# Patient Record
Sex: Female | Born: 1985
Health system: Southern US, Community
[De-identification: ages and names within clinical notes are randomized; demographics above are authoritative.]

## PROBLEM LIST (undated history)

## (undated) DIAGNOSIS — R87619 Unspecified abnormal cytological findings in specimens from cervix uteri: Secondary | ICD-10-CM

## (undated) DIAGNOSIS — T8859XA Other complications of anesthesia, initial encounter: Secondary | ICD-10-CM

## (undated) DIAGNOSIS — E559 Vitamin D deficiency, unspecified: Secondary | ICD-10-CM

## (undated) DIAGNOSIS — A6000 Herpesviral infection of urogenital system, unspecified: Secondary | ICD-10-CM

## (undated) DIAGNOSIS — T4145XA Adverse effect of unspecified anesthetic, initial encounter: Secondary | ICD-10-CM

## (undated) DIAGNOSIS — E66811 Obesity, class 1: Secondary | ICD-10-CM

## (undated) DIAGNOSIS — J302 Other seasonal allergic rhinitis: Secondary | ICD-10-CM

## (undated) DIAGNOSIS — E669 Obesity, unspecified: Secondary | ICD-10-CM

## (undated) DIAGNOSIS — O24419 Gestational diabetes mellitus in pregnancy, unspecified control: Secondary | ICD-10-CM

## (undated) HISTORY — DX: Other seasonal allergic rhinitis: J30.2

## (undated) HISTORY — DX: Obesity, unspecified: E66.9

## (undated) HISTORY — DX: Obesity, class 1: E66.811

## (undated) HISTORY — DX: Vitamin D deficiency, unspecified: E55.9

## (undated) HISTORY — DX: Herpesviral infection of urogenital system, unspecified: A60.00

## (undated) HISTORY — DX: Unspecified abnormal cytological findings in specimens from cervix uteri: R87.619

---

## 2010-03-30 ENCOUNTER — Emergency Department (HOSPITAL_COMMUNITY): Admission: EM | Admit: 2010-03-30 | Discharge: 2010-03-30 | Payer: Self-pay | Admitting: Emergency Medicine

## 2010-06-24 ENCOUNTER — Emergency Department (HOSPITAL_COMMUNITY): Admission: EM | Admit: 2010-06-24 | Discharge: 2010-06-24 | Payer: Self-pay | Admitting: Emergency Medicine

## 2010-06-28 ENCOUNTER — Emergency Department (HOSPITAL_COMMUNITY): Admission: EM | Admit: 2010-06-28 | Discharge: 2010-06-28 | Payer: Self-pay | Admitting: Emergency Medicine

## 2010-07-22 ENCOUNTER — Emergency Department (HOSPITAL_COMMUNITY): Admission: EM | Admit: 2010-07-22 | Discharge: 2010-07-22 | Payer: Self-pay | Admitting: Emergency Medicine

## 2010-08-13 ENCOUNTER — Ambulatory Visit (HOSPITAL_COMMUNITY): Admission: RE | Admit: 2010-08-13 | Discharge: 2010-08-13 | Payer: Self-pay | Admitting: Family Medicine

## 2010-12-31 LAB — URINALYSIS, ROUTINE W REFLEX MICROSCOPIC
Glucose, UA: NEGATIVE mg/dL
Glucose, UA: NEGATIVE mg/dL
Hgb urine dipstick: NEGATIVE
Hgb urine dipstick: NEGATIVE
Protein, ur: NEGATIVE mg/dL
Specific Gravity, Urine: 1.023 (ref 1.005–1.030)
Specific Gravity, Urine: 1.021 (ref 1.005–1.030)
pH: 6 (ref 5.0–8.0)
pH: 6.5 (ref 5.0–8.0)

## 2010-12-31 LAB — HCG, QUANTITATIVE, PREGNANCY: hCG, Beta Chain, Quant, S: 74406 m[IU]/mL — ABNORMAL HIGH (ref <5)

## 2010-12-31 LAB — DIFFERENTIAL
Basophils Absolute: 0.1 10*3/uL (ref 0.0–0.1)
Basophils Relative: 0 % (ref 0–1)
Basophils Relative: 1 % (ref 0–1)
Lymphocytes Relative: 25 % (ref 12–46)
Lymphs Abs: 1.7 10*3/uL (ref 0.7–4.0)
Monocytes Absolute: 0.4 10*3/uL (ref 0.1–1.0)
Monocytes Relative: 4 % (ref 3–12)
Neutro Abs: 7.3 10*3/uL (ref 1.7–7.7)
Neutro Abs: 5 10*3/uL (ref 1.7–7.7)
Neutrophils Relative %: 63 % (ref 43–77)

## 2010-12-31 LAB — CBC
MCHC: 34 g/dL (ref 30.0–36.0)
Platelets: 249 10*3/uL (ref 150–400)
Platelets: 252 10*3/uL (ref 150–400)
RDW: 13.5 % (ref 11.5–15.5)
RDW: 13.7 % (ref 11.5–15.5)
WBC: 9.6 10*3/uL (ref 4.0–10.5)
WBC: 8 10*3/uL (ref 4.0–10.5)

## 2010-12-31 LAB — COMPREHENSIVE METABOLIC PANEL
ALT: 16 U/L (ref 0–35)
Albumin: 2.8 g/dL — ABNORMAL LOW (ref 3.5–5.2)
Alkaline Phosphatase: 40 U/L (ref 39–117)
Calcium: 8.9 mg/dL (ref 8.4–10.5)
Potassium: 3.8 mEq/L (ref 3.5–5.1)
Sodium: 137 mEq/L (ref 135–145)
Total Protein: 6.1 g/dL (ref 6.0–8.3)

## 2010-12-31 LAB — URINE MICROSCOPIC-ADD ON

## 2010-12-31 LAB — URINE CULTURE: Culture  Setup Time: 201110060157

## 2010-12-31 LAB — WET PREP, GENITAL
Trich, Wet Prep: NONE SEEN
Trich, Wet Prep: NONE SEEN

## 2010-12-31 LAB — GC/CHLAMYDIA PROBE AMP, GENITAL
Chlamydia, DNA Probe: NEGATIVE
GC Probe Amp, Genital: NEGATIVE
GC Probe Amp, Genital: NEGATIVE

## 2011-01-04 LAB — URINE MICROSCOPIC-ADD ON

## 2011-01-04 LAB — URINALYSIS, ROUTINE W REFLEX MICROSCOPIC
Glucose, UA: NEGATIVE mg/dL
Ketones, ur: 15 mg/dL — AB
Nitrite: POSITIVE — AB
Protein, ur: NEGATIVE mg/dL
pH: 5 (ref 5.0–8.0)

## 2011-01-04 LAB — PREGNANCY, URINE: Preg Test, Ur: NEGATIVE

## 2011-01-08 ENCOUNTER — Inpatient Hospital Stay (HOSPITAL_COMMUNITY): Admission: AD | Admit: 2011-01-08 | Payer: Self-pay | Admitting: Obstetrics & Gynecology

## 2013-08-10 ENCOUNTER — Encounter (HOSPITAL_COMMUNITY): Payer: Self-pay | Admitting: Emergency Medicine

## 2013-08-10 ENCOUNTER — Emergency Department (HOSPITAL_COMMUNITY)
Admission: EM | Admit: 2013-08-10 | Discharge: 2013-08-10 | Disposition: A | Discharge status: Home / Self Care | Payer: Self-pay | Attending: Emergency Medicine | Admitting: Emergency Medicine

## 2013-08-10 DIAGNOSIS — S30860A Insect bite (nonvenomous) of lower back and pelvis, initial encounter: Secondary | ICD-10-CM | POA: Insufficient documentation

## 2013-08-10 DIAGNOSIS — IMO0002 Reserved for concepts with insufficient information to code with codable children: Secondary | ICD-10-CM | POA: Insufficient documentation

## 2013-08-10 DIAGNOSIS — S90569A Insect bite (nonvenomous), unspecified ankle, initial encounter: Secondary | ICD-10-CM | POA: Insufficient documentation

## 2013-08-10 DIAGNOSIS — Y92009 Unspecified place in unspecified non-institutional (private) residence as the place of occurrence of the external cause: Secondary | ICD-10-CM | POA: Insufficient documentation

## 2013-08-10 DIAGNOSIS — W57XXXA Bitten or stung by nonvenomous insect and other nonvenomous arthropods, initial encounter: Secondary | ICD-10-CM | POA: Insufficient documentation

## 2013-08-10 DIAGNOSIS — Y939 Activity, unspecified: Secondary | ICD-10-CM | POA: Insufficient documentation

## 2013-08-10 MED ORDER — DIPHENHYDRAMINE HCL 25 MG PO CAPS: 25.0000 mg | ORAL_CAPSULE | Freq: Once | ORAL | Status: DC

## 2013-08-10 NOTE — ED Provider Notes (Signed)
CSN: [id number]     Arrival date & time 08/10/13  4098 History   First MD Initiated Contact with Patient 08/10/13 0730     Chief Complaint  Patient presents with  . Rash   (Consider location/radiation/quality/duration/timing/severity/associated sxs/prior Treatment) Patient is a 27 y.o. female presenting with rash.  Rash Location: predominantly on legs, some on trunk. Quality: itchiness   Quality: not draining, not painful and not red   Severity:  Severe Onset quality:  Gradual Duration:  1 week Timing:  Constant Progression:  Worsening Chronicity:  New Context: insect bite/sting   Context comment:  Pt reports she has a flea infestation, and has seen them on her skin throughout this week. Relieved by:  Nothing Ineffective treatments: benadryl and hydrocortisone. Associated symptoms: no fever     History reviewed. No pertinent past medical history. Past Surgical History  Procedure Laterality Date  . Cesarean section     History reviewed. No pertinent family history. History  Substance Use Topics  . Smoking status: Never Smoker   . Smokeless tobacco: Not on file  . Alcohol Use: Yes   OB History   Grav Para Term Preterm Abortions TAB SAB Ect Mult Living                 Review of Systems  Constitutional: Negative for fever.  Skin: Positive for rash.  All other systems reviewed and are negative.    Allergies  Tetracyclines & related  Home Medications  No current outpatient prescriptions on file. BP 124/69  Pulse 81  Temp(Src) 98 F (36.7 C) (Oral)  Resp 18  SpO2 98% Physical Exam  Nursing note and vitals reviewed. Constitutional: She is oriented to person, place, and time. She appears well-developed and well-nourished. No distress.  HENT:  Head: Normocephalic and atraumatic.  Eyes: Conjunctivae are normal. No scleral icterus.  Neck: Neck supple.  Cardiovascular: Normal rate and intact distal pulses.   Pulmonary/Chest: Effort normal. No stridor. No  respiratory distress.  Abdominal: Normal appearance. She exhibits no distension.  Neurological: She is alert and oriented to person, place, and time.  Skin: Skin is warm and dry. Rash noted. Rash is papular (multiple papular lesions, mostly on feet and legs, few on stomach and left breast.  No surrounding redness, no edema, no fluctuance, no purulence, no induration.).  Psychiatric: She has a normal mood and affect. Her behavior is normal.    ED Course  Procedures (including critical care time) Labs Review Labs Reviewed - No data to display Imaging Review No results found.  EKG Interpretation   None       MDM   1. Flea bite of multiple sites    27 yo female with multiple papular lesions on legs and trunk, consistent with insect bites.  She reports a flea infestation.  None appear infected and she has no symptoms of systemic infection.  Advised continued use of benadryl for itching (clarified dose for her) and hydrocortisone cream.  Advised eradication of infestation and gave return precautions for signs of infection.      Candyce Churn, MD 08/10/13 [phone]

## 2013-08-10 NOTE — ED Notes (Signed)
Dr Loretha Stapler at bedside  Pt tearful telling story about being bit and seeing fleas on her feet and her legs. Pt states she is sure that they are fleas because she has seen them before.

## 2013-08-10 NOTE — ED Notes (Signed)
Pt alert and mentating appropriately upon d/c.Pt given d/c teaching, follow up care instructions and at home pain management. Pt verbalizes understanding and has no further questions upon d/c. Pt ambulatory leaving ER. NAD noted upon d/c.

## 2013-08-10 NOTE — ED Notes (Signed)
Pt states her dogs have fleas and knows that they are in the house. Pt states pain is bad and burns and itches constantly. Pt states she has had this before. Pt denies fever, n/v/d. Pt alert and mentating appropriately. Pt has bite marks noted to feet bilaterally . Red raised circular areas.

## 2014-04-04 ENCOUNTER — Ambulatory Visit (INDEPENDENT_AMBULATORY_CARE_PROVIDER_SITE_OTHER): Payer: 59 | Admitting: Family Medicine

## 2014-04-04 ENCOUNTER — Encounter: Payer: Self-pay | Admitting: Family Medicine

## 2014-04-04 VITALS — BP 98/68 | HR 101 | Temp 99.4°F | Ht 67.0 in | Wt 198.0 lb

## 2014-04-04 DIAGNOSIS — E669 Obesity, unspecified: Secondary | ICD-10-CM

## 2014-04-04 DIAGNOSIS — Z7689 Persons encountering health services in other specified circumstances: Secondary | ICD-10-CM

## 2014-04-04 DIAGNOSIS — Z7189 Other specified counseling: Secondary | ICD-10-CM

## 2014-04-04 LAB — LIPID PANEL
CHOLESTEROL: 168 mg/dL (ref 0–200)
HDL: 53.2 mg/dL (ref >39.00)
LDL Cholesterol: 102 mg/dL — ABNORMAL HIGH (ref 0–99)
NonHDL: 114.8
Total CHOL/HDL Ratio: 3
Triglycerides: 65 mg/dL (ref 0.0–149.0)
VLDL: 13 mg/dL (ref 0.0–40.0)

## 2014-04-04 LAB — HEMOGLOBIN A1C: HEMOGLOBIN A1C: 5 % (ref 4.6–6.5)

## 2014-04-04 NOTE — Progress Notes (Signed)
No chief complaint on file.   HPI:  Julia Ruiz is here to establish care. Recently moved from [place]. Wants to check cholesterol. Only medical problems are obesity and hx of remote abnormal pap. Denies: CP, SOB, changes in bowels, diarrhea, dysuria, abnormal vaginal bleeding, depression, allergies. Last PCP and physical: 2013 pap normal - remote abnormal pap in 2005  Has the following chronic problems and concerns today:  Patient Active Problem List   Diagnosis Date Noted  . Obesity 04/04/2014   Health Maintenance:vaccines UTD, needs to schedule pap  ROS: See pertinent positives and negatives per HPI.  Past Medical History  Diagnosis Date  . Abnormal cervical Papanicolaou smear     as teenager, normal since per her report    Family History  Problem Relation Age of Onset  . Mental illness Brother     History   Social History  . Marital Status: Married    Spouse Name: N/A    Number of Children: N/A  . Years of Education: N/A   Social History Main Topics  . Smoking status: Never Smoker   . Smokeless tobacco: None  . Alcohol Use: Yes     Comment: 2 glasees of wine occ  . Drug Use: No  . Sexual Activity: None   Other Topics Concern  . 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  GENERAL: vitals reviewed and listed above, alert, oriented, appears well hydrated and in no acute distress  HEENT: atraumatic, conjunttiva clear, no obvious abnormalities on inspection of external nose and ears  NECK: no obvious masses on inspection  LUNGS: clear to  auscultation bilaterally, no wheezes, rales or rhonchi, good air movement  CV: HRRR, no peripheral edema  MS: moves all extremities without noticeable abnormality  PSYCH: pleasant and cooperative, no obvious depression or anxiety  ASSESSMENT AND PLAN:  Discussed the following assessment and plan:  Encounter to establish care  Obesity - Plan: Lipid Panel, Hemoglobin A1c   -We reviewed the PMH, PSH, FH, SH, Meds and Allergies. -We provided refills for any medications we will prescribe as needed. -We addressed current concerns per orders and patient instructions. -We have asked for records for pertinent exams, studies, vaccines and notes from previous providers. -We have advised patient to follow up per instructions below. -fasting labs  -Patient advised to return or notify a doctor immediately if symptoms worsen or persist or new concerns arise.  Patient Instructions  -We have ordered labs or studies at this visit. It can take up to 1-2 weeks for results and processing. We will contact you with instructions IF your results are abnormal. Normal results will be released to your [facility]. If you have not heard from us or can not find your results in [facility] in 2 weeks please contact our office.  -PLEASE SIGN UP FOR MYCHART TODAY   We recommend the following healthy lifestyle measures: - eat a healthy diet consisting of lots of vegetables, fruits, beans, nuts, seeds, healthy meats such as white chicken and  fish and whole grains.  - avoid fried foods, fast food, processed foods, sodas, red meet and other fattening foods.  - get a least 150 minutes of aerobic exercise per week.   Follow up in: in next 3-4 months for female well visit      Terressa KoyanagiKIM, HANNAH R.

## 2014-04-04 NOTE — Patient Instructions (Signed)
-  We have ordered labs or studies at this visit. It can take up to 1-2 weeks for results and processing. We will contact you with instructions IF your results are abnormal. Normal results will be released to your [facility]. If you have not heard from us or can not find your results in [facility] in 2 weeks please contact our office.  -PLEASE SIGN UP FOR MYCHART TODAY   We recommend the following healthy lifestyle measures: - eat a healthy diet consisting of lots of vegetables, fruits, beans, nuts, seeds, healthy meats such as white chicken and fish and whole grains.  - avoid fried foods, fast food, processed foods, sodas, red meet and other fattening foods.  - get a least 150 minutes of aerobic exercise per week.   Follow up in: in next 3-4 months for female well visit

## 2014-04-04 NOTE — Progress Notes (Signed)
Pre visit review using our clinic review tool, if applicable. No additional management support is needed unless otherwise documented below in the visit note. 

## 2014-05-30 ENCOUNTER — Ambulatory Visit (INDEPENDENT_AMBULATORY_CARE_PROVIDER_SITE_OTHER): Payer: 59 | Admitting: Physician Assistant

## 2014-05-30 ENCOUNTER — Other Ambulatory Visit (HOSPITAL_COMMUNITY)
Admission: RE | Admit: 2014-05-30 | Discharge: 2014-05-30 | Disposition: A | Discharge status: Home / Self Care | Payer: 59 | Source: Ambulatory Visit | Attending: Physician Assistant | Admitting: Physician Assistant

## 2014-05-30 ENCOUNTER — Encounter: Payer: Self-pay | Admitting: Physician Assistant

## 2014-05-30 VITALS — BP 120/70 | HR 72 | Temp 99.0°F | Resp 18 | Wt 204.0 lb

## 2014-05-30 DIAGNOSIS — N760 Acute vaginitis: Secondary | ICD-10-CM | POA: Diagnosis present

## 2014-05-30 DIAGNOSIS — R300 Dysuria: Secondary | ICD-10-CM

## 2014-05-30 DIAGNOSIS — Z113 Encounter for screening for infections with a predominantly sexual mode of transmission: Secondary | ICD-10-CM | POA: Diagnosis not present

## 2014-05-30 LAB — POCT URINALYSIS DIPSTICK
BILIRUBIN UA: NEGATIVE
GLUCOSE UA: NEGATIVE
Ketones, UA: NEGATIVE
Leukocytes, UA: NEGATIVE
Nitrite, UA: NEGATIVE
Spec Grav, UA: 1.02
Urobilinogen, UA: 0.2
pH, UA: 6.5

## 2014-05-30 MED ORDER — METRONIDAZOLE 500 MG PO TABS: 500.0000 mg | ORAL_TABLET | Freq: Two times a day (BID) | ORAL | Status: DC

## 2014-05-30 NOTE — Patient Instructions (Addendum)
Metronidazole twice daily for 7 days to treat what is likely bacterial vaginosis.  We are running your urine for other possible causes of your dysuria and vaginal discharge, as well as to confirm the diagnosis of bacterial vaginosis. We will call you with the results of these when available.  Push fluid hydration with water. You can continue cranberry juice.  If emergency symptoms discussed during visit developed, seek medical attention immediately.  Followup as needed, or for worsening or persistent symptoms despite treatment.    Bacterial Vaginosis Bacterial vaginosis is an infection of the vagina. It happens when too many of certain germs (bacteria) grow in the vagina. HOME CARE  Take your medicine as told by your doctor.  Finish your medicine even if you start to feel better.  Do not have sex until you finish your medicine and are better.  Tell your sex partner that you have an infection. They should see their doctor for treatment.  Practice safe sex. Use condoms. Have only one sex partner. GET HELP IF:  You are not getting better after 3 days of treatment.  You have more grey fluid (discharge) coming from your vagina than before.  You have more pain than before.  You have a fever. MAKE SURE YOU:   Understand these instructions.  Will watch your condition.  Will get help right away if you are not doing well or get worse. Document Released: 07/13/2008 Document Revised: 07/25/2013 Document Reviewed: 05/16/2013 [facility] Patient Information 2015 [employer] [place]. This information is not intended to replace advice given to you by your health care provider. Make sure you discuss any questions you have with your health care provider.

## 2014-05-30 NOTE — Progress Notes (Signed)
Subjective:    Patient ID: Julia Ruiz, female    DOB: 09/30/1986, 28 y.o.   MRN: [id number]  Dysuria  This is a new problem. The current episode started in the past 7 days. The problem occurs intermittently. The problem has been unchanged. The quality of the pain is described as burning and aching. The pain is at a severity of 5/10. The pain is moderate. There has been no fever. She is sexually active (married, monogamous.). There is no history of pyelonephritis. Associated symptoms include a discharge (thick, white discharge, burning, fishy odor.), frequency and urgency. Pertinent negatives include no chills, flank pain, hematuria, hesitancy, nausea, possible pregnancy, sweats or vomiting. She has tried increased fluids (cranberry juice.) for the symptoms. The treatment provided mild relief. There is no history of catheterization, kidney stones, recurrent UTIs, a single kidney, urinary stasis or a urological procedure.      Review of Systems  Constitutional: Negative for fever and chills.  Respiratory: Negative for shortness of breath.   Cardiovascular: Negative for chest pain.  Gastrointestinal: Negative for nausea, vomiting and diarrhea.  Genitourinary: Positive for dysuria, urgency, frequency and vaginal discharge. Negative for hesitancy, hematuria and flank pain.  Neurological: Negative for syncope and headaches.  All other systems reviewed and are negative.    Past Medical History  Diagnosis Date  . Abnormal cervical Papanicolaou smear     as teenager, normal since per her report    History   Social History  . Marital Status: Married    Spouse Name: N/A    Number of Children: N/A  . Years of Education: N/A   Occupational History  . Not on file.   Social History Main Topics  . Smoking status: Never Smoker   . Smokeless tobacco: Not on file  . Alcohol Use: Yes     Comment: 2 glasees of wine occ  . Drug Use: No  . Sexual Activity: Not on file   Other Topics Concern   . 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 Cesarean section      Family History  Problem Relation Age of Onset  . Mental illness Brother     Allergies  Allergen Reactions  . Tetracyclines & Related Other (See Comments)    wheezing    Current Outpatient Prescriptions on File Prior to Visit  Medication Sig Dispense Refill  . Multiple Vitamin (MULTIVITAMINS PO) Take by mouth.       No current facility-administered medications on file prior to visit.    EXAM: BP 120/70  Pulse 72  Temp(Src) 99 F (37.2 C) (Oral)  Resp 18  Wt 204 lb (92.534 kg)  LMP 05/25/2014     Objective:   Physical Exam  Nursing note and vitals reviewed. Constitutional: She is oriented to person, place, and time. She appears well-developed and well-nourished. No distress.  HENT:  Head: Normocephalic and atraumatic.  Cardiovascular: Normal rate, regular rhythm and intact distal pulses.   Pulmonary/Chest: Effort normal. No respiratory distress.  Neurological: She is alert and oriented to person, place, and time.  Skin: Skin is warm and dry. She is not diaphoretic.  Psychiatric: She has a normal mood and affect. Her behavior  is normal. Judgment and thought content normal.     Lab Results  Component Value Date   CHOL 168 04/04/2014   TRIG 65.0 04/04/2014   HDL 53.20 04/04/2014   LDLCALC 102* 04/04/2014   HGBA1C 5.0 04/04/2014        Assessment & Plan:  Shakari was seen today for dysuria.  Diagnoses and associated orders for this visit:  Dysuria Comments: UA with only trace blood and protein, Symptoms consistent with BV. Will treat Flagyl. - POCT urinalysis dipstick; Standing - POCT urinalysis dipstick - Urine cytology ancillary only - Culture,  Urine - metroNIDAZOLE (FLAGYL) 500 MG tablet; Take 1 tablet (500 mg total) by mouth 2 (two) times daily.    Discussed treatment options with pt. Options were waiting for test results, vs treating possible BV. Pt opted to treat. Desired testing for GU infectious organisms as well. Pt states she is not really concerned as she is married and monogamous, however just to be safe.   Continue to push fluid hydration.  Return precautions provided, and patient handout on bacterial vaginosis.  Plan to follow up as needed, or for worsening or persistent symptoms despite treatment.  Patient Instructions  Metronidazole twice daily for 7 days to treat what is likely bacterial vaginosis.  We are running your urine for other possible causes of your dysuria and vaginal discharge, as well as to confirm the diagnosis of bacterial vaginosis. We will call you with the results of these when available.  Push fluid hydration with water. You can continue cranberry juice.  If emergency symptoms discussed during visit developed, seek medical attention immediately.  Followup as needed, or for worsening or persistent symptoms despite treatment.

## 2014-06-02 LAB — URINE CULTURE

## 2014-06-04 ENCOUNTER — Other Ambulatory Visit: Payer: Self-pay | Admitting: Physician Assistant

## 2014-06-04 ENCOUNTER — Telehealth: Payer: Self-pay | Admitting: Family Medicine

## 2014-06-04 DIAGNOSIS — N390 Urinary tract infection, site not specified: Secondary | ICD-10-CM

## 2014-06-04 MED ORDER — NITROFURANTOIN MONOHYD MACRO 100 MG PO CAPS: 100.0000 mg | ORAL_CAPSULE | Freq: Two times a day (BID) | ORAL | Status: DC

## 2014-06-04 NOTE — Telephone Encounter (Signed)
Pt was seen by Susy FrizzleMatt last week and he had written her an rx for metroNIDAZOLE (FLAGYL) 500 MG tablet, pt states the pharmacy called her today and stated there was another rx for her. Pt states matt was waiting on another test to come back. Pt wants to know what the results were and what the rx is that was called in today.

## 2014-06-04 NOTE — Telephone Encounter (Signed)
Left a message for return call.  

## 2014-06-04 NOTE — Telephone Encounter (Signed)
Spoke with pt and pt is aware.  See result note.  

## 2014-06-10 ENCOUNTER — Encounter: Payer: 59 | Admitting: Family Medicine

## 2014-06-18 ENCOUNTER — Ambulatory Visit (INDEPENDENT_AMBULATORY_CARE_PROVIDER_SITE_OTHER): Payer: 59 | Admitting: Family Medicine

## 2014-06-18 ENCOUNTER — Telehealth: Payer: Self-pay | Admitting: Family Medicine

## 2014-06-18 ENCOUNTER — Encounter: Payer: Self-pay | Admitting: Family Medicine

## 2014-06-18 VITALS — BP 104/80 | HR 81 | Temp 98.6°F | Ht 67.0 in | Wt 206.0 lb

## 2014-06-18 DIAGNOSIS — N3000 Acute cystitis without hematuria: Secondary | ICD-10-CM

## 2014-06-18 DIAGNOSIS — L292 Pruritus vulvae: Secondary | ICD-10-CM

## 2014-06-18 DIAGNOSIS — L293 Anogenital pruritus, unspecified: Secondary | ICD-10-CM

## 2014-06-18 MED ORDER — FLUCONAZOLE 150 MG PO TABS: 150.0000 mg | ORAL_TABLET | Freq: Once | ORAL | Status: DC

## 2014-06-18 NOTE — Telephone Encounter (Signed)
Patient Information:  Caller Name: Tyra  Phone: [phone]  Patient: Julia, Ruiz  Gender: Female  DOB: 1986-03-24  Age: 28 Years  PCP: Selena Batten (TEXT 1st, after 20 mins can call), [place] Client [facility])  Pregnant: No  Office Follow Up:  Does the office need to follow up with this patient?: No  Instructions For The Office: N/A  RN Note:  Appt. scheduled with  Kriste Basque at 16:00.  Symptoms  Reason For Call & Symptoms: Thinks she has a yeast infection. Treated with 2 antibiotics for  BV and UTI. Onset 06/16/14.Symptoms include itching, and  thick white discharge.  Reviewed Health History In EMR: Yes  Reviewed Medications In EMR: Yes  Reviewed Allergies In EMR: Yes  Reviewed Surgeries / Procedures: Yes  Date of Onset of Symptoms: 06/16/2014 OB / GYN:  LMP: 05/28/2014  Guideline(s) Used:  Vaginal Discharge  Disposition Per Guideline:   See Within 3 Days in Office  Reason For Disposition Reached:   Symptoms of a yeast infection" (i.e., itchy, white discharge, not bad smelling) and not improved > 3 days following Care Advice  Advice Given:  Call Back If:  You become worse.  Patient Will Follow Care Advice:  YES  Appointment Scheduled:  06/18/2014 16:00:00 Appointment Scheduled Provider:  Selena Batten (TEXT 1st, after 20 mins can call), [place] Client [facility])

## 2014-06-18 NOTE — Progress Notes (Signed)
Pre visit review using our clinic review tool, if applicable. No additional management support is needed unless otherwise documented below in the visit note. 

## 2014-06-18 NOTE — Progress Notes (Signed)
No chief complaint on file.   HPI:  Acute visit for:  1) Vaginal discharge: -treated with flagyl 05/30/14 for BV and with macrobid for UTI, CG/Chlam neg -reports: was doing better and all symptoms had resolved - then developed a little vulvovag pruritis 2 days ago -thinks this is yeast from all the abx -just starting period and prefers not to do exam today if possible -denies: hematuria, dysuria, pelvic pain, abd pain, nausea, vomiting, vaginal discharge -FDLMP: today  ROS: See pertinent positives and negatives per HPI.  Past Medical History  Diagnosis Date  . Abnormal cervical Papanicolaou smear     as teenager, normal since per her report    Past Surgical History  Procedure Laterality Date  . Cesarean section      Family History  Problem Relation Age of Onset  . Mental illness Brother     History   Social History  . Marital Status: Married    Spouse Name: N/A    Number of Children: N/A  . Years of Education: N/A   Social History Main Topics  . Smoking status: Never Smoker   . Smokeless tobacco: None  . Alcohol Use: Yes     Comment: 2 glasees of wine occ  . Drug Use: No  . Sexual Activity: None   Other Topics Concern  . 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  GENERAL: vitals reviewed and listed above, alert, oriented, appears well hydrated and in no acute distress  HEENT: atraumatic, conjunttiva clear, no obvious abnormalities on  inspection of external nose and ears  NECK: no obvious masses on inspection  LUNGS: clear to auscultation bilaterally, no wheezes, rales or rhonchi, good air movement  CV: HRRR, no peripheral edema  MS: moves all extremities without noticeable abnormality  PSYCH: pleasant and cooperative, no obvious depression or anxiety  ASSESSMENT AND PLAN:  Discussed the following assessment and plan:  Vulvovaginal pruritus - Plan: fluconazole (DIFLUCAN) 150 MG tablet  Acute cystitis without hematuria - Plan: Urine culture  -OTC yeast tx, diflucan if does not work -u. Culture per her request to ensure uti resolved -follow up if any symptoms persist or worsen -Patient advised to return or notify a doctor immediately if symptoms worsen or persist or new concerns arise.  There are no Patient Instructions on file for this visit.   Kriste Basque R.

## 2014-06-20 LAB — URINE CULTURE
COLONY COUNT: NO GROWTH
Organism ID, Bacteria: NO GROWTH

## 2014-07-01 ENCOUNTER — Encounter: Payer: Self-pay | Admitting: Family Medicine

## 2014-07-01 ENCOUNTER — Ambulatory Visit (INDEPENDENT_AMBULATORY_CARE_PROVIDER_SITE_OTHER): Payer: 59 | Admitting: Family Medicine

## 2014-07-01 ENCOUNTER — Other Ambulatory Visit (HOSPITAL_COMMUNITY)
Admission: RE | Admit: 2014-07-01 | Discharge: 2014-07-01 | Disposition: A | Discharge status: Home / Self Care | Payer: 59 | Source: Ambulatory Visit | Attending: Family Medicine | Admitting: Family Medicine

## 2014-07-01 VITALS — BP 100/72 | HR 90 | Temp 98.7°F | Ht 66.5 in | Wt 211.0 lb

## 2014-07-01 DIAGNOSIS — Z0000 Encounter for general adult medical examination without abnormal findings: Secondary | ICD-10-CM

## 2014-07-01 DIAGNOSIS — Z113 Encounter for screening for infections with a predominantly sexual mode of transmission: Secondary | ICD-10-CM | POA: Insufficient documentation

## 2014-07-01 DIAGNOSIS — Z01419 Encounter for gynecological examination (general) (routine) without abnormal findings: Secondary | ICD-10-CM | POA: Insufficient documentation

## 2014-07-01 DIAGNOSIS — Z23 Encounter for immunization: Secondary | ICD-10-CM

## 2014-07-01 DIAGNOSIS — L732 Hidradenitis suppurativa: Secondary | ICD-10-CM

## 2014-07-01 DIAGNOSIS — E669 Obesity, unspecified: Secondary | ICD-10-CM

## 2014-07-01 DIAGNOSIS — N760 Acute vaginitis: Secondary | ICD-10-CM

## 2014-07-01 MED ORDER — CLINDAMYCIN PHOSPHATE 1 % EX GEL: Freq: Two times a day (BID) | CUTANEOUS | Status: DC

## 2014-07-01 NOTE — Patient Instructions (Addendum)
-  We have ordered labs or studies at this visit. It can take up to 1-2 weeks for results and processing. We will contact you with instructions IF your results are abnormal. Normal results will be released to your [facility]. If you have not heard from [place] or can not find your results in [facility] in 2 weeks please contact our office.  -PLEASE SIGN UP FOR MYCHART TODAY   We recommend the following healthy lifestyle measures: - eat a healthy diet consisting of lots of vegetables, fruits, beans, nuts, seeds, healthy meats such as white chicken and fish and whole grains.  - avoid fried foods, fast food, processed foods, sodas, red meet and other fattening foods.  - get a least 150 minutes of aerobic exercise per week.   -gel to upper thighs twice daily  Follow up in:1 year and as needed

## 2014-07-01 NOTE — Progress Notes (Signed)
No chief complaint on file.   HPI:  Here for CPE:  -Concerns and/or follow up today:   1)Vaginal discharge and pruritis: -seen 9/1 and she preferred not to do exam, treated with OTC yeast cream -reports: resolved, no symptoms -denies: discharge, pain, itching  2)Obesity: -advised healthy diet and regular exercise -Diet: variety of foods, balance and well rounded, larger portion sizes -Exercise: no regular exercise;   3)Boils: -on and off on upper thighs -in bikini area, used to get them under arms as well  Preventive Health Maintenance:  -Taking folic acid, vitamin D or calcium: takes multivitamin  -Diabetes and Dyslipidemia Screening: done and ok  -Hx of HTN: no  -Vaccines: needs flu  -pap history: reports mild abnormal 2005, but all normal since, reports last pap 2013 normal she thinks but wants to do today  -FDLMP: 06/18/2014  -sexual activity: yes, female partner, no new partners  -wants STI testing: GC/Chlam done 05/2014 negative  -FH breast, colon or ovarian ca: see FH Last mammogram: n/a Last colon cancer screening: n/a  Breast Ca Risk Assessment: -no family or personal hx breast or ovarian cancer  -Alcohol, Tobacco, drug use: see social history  Review of Systems - no fevers, unintentional weight loss, vision loss, hearing loss, chest pain, sob, hemoptysis, melena, hematochezia, hematuria, genital discharge, changing or concerning skin lesions, bleeding, bruising, loc, thoughts of self harm or SI  Past Medical History  Diagnosis Date  . Abnormal cervical Papanicolaou smear     as teenager, normal since per her report    Past Surgical History  Procedure Laterality Date  . Cesarean section      Family History  Problem Relation Age of Onset  . Mental illness Brother     History   Social History  . Marital Status: Married    Spouse Name: N/A    Number of Children: N/A  . Years of Education: N/A   Social History Main Topics  . Smoking status:  Never Smoker   . Smokeless tobacco: None  . Alcohol Use: Yes     Comment: 2 glasees of wine occ  . Drug Use: No  . Sexual Activity: None   Other Topics Concern  . 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      Home Situation: lives husband and 2 children      Spiritual Beliefs: Christian      Lifestyle: no regular exercise; diet is good    Current outpatient prescriptions:Multiple Vitamin (MULTIVITAMINS PO), Take by mouth., Disp: , Rfl: ;  Multiple Vitamins-Minerals (HAIR/SKIN/NAILS PO), Take by mouth daily., Disp: , Rfl: ;  clindamycin (CLINDAGEL) 1 % gel, Apply topically 2 (two) times daily., Disp: 30 g, Rfl: 2  EXAM:  Filed Vitals:   07/01/14 1602  BP: 100/72  Pulse: 90  Temp: 98.7 F (37.1 C)    GENERAL: vitals reviewed and listed below, alert, oriented, appears well hydrated and in no acute distress  HEENT: head atraumatic, PERRLA, normal appearance of eyes, ears, nose and mouth. moist mucus membranes.  NECK: supple, no masses or lymphadenopathy  LUNGS: clear to auscultation bilaterally, no rales, rhonchi or wheeze  CV: HRRR, no peripheral edema or cyanosis, normal pedal pulses  BREAST: normal appearance - no lesions or discharge, on palpation normal breast tissue without any suspicious masses  ABDOMEN: bowel sounds normal, soft, non tender to palpation, no masses, no rebound or guarding  GU: normal appearance of external genitalia - no  lesions or masses, normal vaginal mucosa - no abnormal discharge, normal appearance of cervix - no lesions or abnormal discharge, no masses or tenderness on palpation of uterus and ovaries.  RECTAL: refused  SKIN: scattered papules and pustules inner thighs/bikini area  MS: normal gait, moves all extremities normally  NEURO: CN II-XII grossly intact, normal muscle strength and sensation to light touch on extremities  PSYCH: normal affect, pleasant and cooperative  ASSESSMENT AND  PLAN:  Discussed the following assessment and plan:  1. Visit for preventive health examination -Discussed and advised all [place] preventive services health task force level A and B recommendations for age, sex and risks. -pap today -flu vaccine given today -labs, studies and vaccines per orders this encounter  2. Obesity, unspecified -Advised at least 150 minutes of exercise per week and a healthy diet low in saturated fats and sweets and consisting of fresh fruits and vegetables, lean meats such as fish and white chicken and whole grains.  3. Vaginitis and vulvovaginitis -resolved  4. Hidradenitis -hurley stage 1, topical clinda   Orders Placed This Encounter  Procedures  . HIV antibody (with reflex)  . RPR    Patient advised to return to clinic immediately if symptoms worsen or persist or new concerns.  Patient Instructions  -We have ordered labs or studies at this visit. It can take up to 1-2 weeks for results and processing. We will contact you with instructions IF your results are abnormal. Normal results will be released to your [facility]. If you have not heard from [place] or can not find your results in [facility] in 2 weeks please contact our office.  -PLEASE SIGN UP FOR MYCHART TODAY   We recommend the following healthy lifestyle measures: - eat a healthy diet consisting of lots of vegetables, fruits, beans, nuts, seeds, healthy meats such as white chicken and fish and whole grains.  - avoid fried foods, fast food, processed foods, sodas, red meet and other fattening foods.  - get a least 150 minutes of aerobic exercise per week.   -gel to upper thighs twice daily  Follow up in:1 year and as needed     Return in about 1 year (around 07/02/2015), or if symptoms worsen or fail to improve.  Julia Basque R.

## 2014-07-01 NOTE — Progress Notes (Signed)
Pre visit review using our clinic review tool, if applicable. No additional management support is needed unless otherwise documented below in the visit note. 

## 2014-07-01 NOTE — Addendum Note (Signed)
Addended by: Johnella Moloney on: 07/01/2014 04:50 PM   Modules accepted: Orders

## 2014-07-02 LAB — RPR

## 2014-07-02 LAB — HIV ANTIBODY (ROUTINE TESTING W REFLEX): HIV 1&2 Ab, 4th Generation: NONREACTIVE

## 2014-07-03 LAB — CYTOLOGY - PAP

## 2014-07-17 ENCOUNTER — Encounter: Payer: Self-pay | Admitting: Family Medicine

## 2014-07-17 ENCOUNTER — Ambulatory Visit (INDEPENDENT_AMBULATORY_CARE_PROVIDER_SITE_OTHER): Payer: 59 | Admitting: Family Medicine

## 2014-07-17 VITALS — BP 108/70 | HR 88 | Temp 98.5°F | Ht 66.5 in | Wt 209.0 lb

## 2014-07-17 DIAGNOSIS — R350 Frequency of micturition: Secondary | ICD-10-CM

## 2014-07-17 DIAGNOSIS — R300 Dysuria: Secondary | ICD-10-CM

## 2014-07-17 LAB — POCT URINALYSIS DIPSTICK
BILIRUBIN UA: NEGATIVE
Glucose, UA: NEGATIVE
Ketones, UA: NEGATIVE
NITRITE UA: POSITIVE
Protein, UA: NEGATIVE
SPEC GRAV UA: 1.01
Urobilinogen, UA: 0.2
pH, UA: 7

## 2014-07-17 MED ORDER — CIPROFLOXACIN HCL 500 MG PO TABS: 500.0000 mg | ORAL_TABLET | Freq: Two times a day (BID) | ORAL | Status: DC

## 2014-07-17 NOTE — Addendum Note (Signed)
Addended by: Johnella MoloneyFUNDERBURK, JO A on: 07/17/2014 12:05 PM   Modules accepted: Orders

## 2014-07-17 NOTE — Progress Notes (Signed)
No chief complaint on file.   HPI:  Dysuria: -started 3 days ago -symptoms: burning, frequency, urgency -denies: flank pain, fevers, nausea, vomiting -FDLMP: started 3 days ago   ROS: See pertinent positives and negatives per HPI.  Past Medical History  Diagnosis Date  . Abnormal cervical Papanicolaou smear     as teenager, normal since per her report    Past Surgical History  Procedure Laterality Date  . Cesarean section      Family History  Problem Relation Age of Onset  . Mental illness Brother     History   Social History  . Marital Status: Married    Spouse Name: N/A    Number of Children: N/A  . Years of Education: N/A   Social History Main Topics  . Smoking status: Never Smoker   . Smokeless tobacco: None  . Alcohol Use: Yes     Comment: 2 glasees of wine occ  . Drug Use: No  . Sexual Activity: None   Other Topics Concern  . 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  GENERAL: vitals reviewed and listed above, alert, oriented, appears well hydrated and in no acute distress  HEENT: atraumatic, conjunttiva clear, no obvious abnormalities on inspection of external nose and ears  NECK: no obvious masses on inspection  LUNGS: clear to auscultation bilaterally, no wheezes, rales or rhonchi, good air movement  ZOX:WRUEABD:soft, NTTP, no CVA  TTP  CV: HRRR, no peripheral edema  MS: moves all extremities without noticeable abnormality  PSYCH: pleasant and cooperative, no obvious depression or anxiety  ASSESSMENT AND PLAN:  Discussed the following assessment and plan:  Urinary frequency - Plan: ciprofloxacin (CIPRO) 500 MG tablet  Dysuria - Plan: POC Urinalysis Dipstick, ciprofloxacin (CIPRO) 500 MG tablet  -we discussed possible serious and likely etiologies, workup and treatment, treatment risks and return precautions -after this discussion, Manasa opted for cipro impirically -of course, we advised Kathyrn  to return or notify a doctor immediately if symptoms worsen or persist or new concerns arise.  .  -Patient advised to return or notify a doctor immediately if symptoms worsen or persist or new concerns arise.  There are no Patient Instructions on file for this visit.   Kriste BasqueKIM, HANNAH R.

## 2014-07-17 NOTE — Progress Notes (Signed)
Pre visit review using our clinic review tool, if applicable. No additional management support is needed unless otherwise documented below in the visit note. 

## 2014-07-20 LAB — URINE CULTURE: Colony Count: >=100000

## 2014-11-15 ENCOUNTER — Ambulatory Visit (INDEPENDENT_AMBULATORY_CARE_PROVIDER_SITE_OTHER): Payer: 59 | Admitting: Family Medicine

## 2014-11-15 ENCOUNTER — Encounter: Payer: Self-pay | Admitting: Family Medicine

## 2014-11-15 VITALS — BP 110/74 | HR 85 | Temp 98.5°F | Wt 213.0 lb

## 2014-11-15 DIAGNOSIS — R350 Frequency of micturition: Secondary | ICD-10-CM

## 2014-11-15 DIAGNOSIS — R300 Dysuria: Secondary | ICD-10-CM

## 2014-11-15 LAB — POCT URINALYSIS DIPSTICK
Bilirubin, UA: NEGATIVE
Glucose, UA: NEGATIVE
Ketones, UA: NEGATIVE
NITRITE UA: NEGATIVE
PROTEIN UA: NEGATIVE
SPEC GRAV UA: 1.02
Urobilinogen, UA: 0.2
pH, UA: 5.5

## 2014-11-15 MED ORDER — CIPROFLOXACIN HCL 500 MG PO TABS: 500.0000 mg | ORAL_TABLET | Freq: Two times a day (BID) | ORAL | Status: DC

## 2014-11-15 NOTE — Patient Instructions (Signed)

## 2014-11-15 NOTE — Progress Notes (Signed)
Pre visit review using our clinic review tool, if applicable. No additional management support is needed unless otherwise documented below in the visit note. 

## 2014-11-15 NOTE — Progress Notes (Signed)
   Subjective:    Patient ID: Julia Ruiz, female    DOB: 01/07/1986, 29 y.o.   MRN: [id number]  HPI Patient seen as a work in with possible UTI. Onset about 4 days ago some urine frequency and burning with urination. Denies any fevers or chills. Some increased lethargy. She had Escherichia coli UTI back in September treated with Cipro. She states she has had frequent UTIs in the past. She has not noted any definite correlation post intercourse. She's had some mild nausea but no vomiting. No flank pain. No history of sexual transmitted infection. She is monogamous. No vaginal discharge.  Past Medical History  Diagnosis Date  . Abnormal cervical Papanicolaou smear     as teenager, normal since per her report   Past Surgical History  Procedure Laterality Date  . Cesarean section      reports that she has never smoked. She does not have any smokeless tobacco history on file. She reports that she drinks alcohol. She reports that she does not use illicit drugs. family history includes Mental illness in her brother. Allergies  Allergen Reactions  . Clindagel [Clindamycin] Itching  . Tetracyclines & Related Other (See Comments)    wheezing      Review of Systems  Constitutional: Positive for fatigue. Negative for fever, chills and appetite change.  Gastrointestinal: Negative for nausea, vomiting, abdominal pain, diarrhea and constipation.  Genitourinary: Positive for dysuria and frequency.  Musculoskeletal: Negative for back pain.  Neurological: Negative for dizziness.       Objective:   Physical Exam  Constitutional: She appears well-developed and well-nourished. No distress.  Cardiovascular: Normal rate and regular rhythm.   Pulmonary/Chest: Effort normal and breath sounds normal. No respiratory distress. She has no wheezes. She has no rales.          Assessment & Plan:  Dysuria. Rule out recurrent UTI. Urine culture sent. Cipro 500 mg twice a day for 3 days. Follow-up when  necessary

## 2014-11-19 LAB — URINE CULTURE: Colony Count: >=100000

## 2014-11-20 ENCOUNTER — Other Ambulatory Visit: Payer: Self-pay

## 2014-11-20 MED ORDER — NITROFURANTOIN MONOHYD MACRO 100 MG PO CAPS: 100.0000 mg | ORAL_CAPSULE | Freq: Two times a day (BID) | ORAL | Status: DC

## 2015-03-26 ENCOUNTER — Encounter: Payer: Self-pay | Admitting: Family Medicine

## 2015-03-26 ENCOUNTER — Ambulatory Visit (INDEPENDENT_AMBULATORY_CARE_PROVIDER_SITE_OTHER): Payer: 59 | Admitting: Family Medicine

## 2015-03-26 VITALS — BP 120/80 | HR 95 | Temp 98.6°F | Wt 213.0 lb

## 2015-03-26 DIAGNOSIS — L298 Other pruritus: Secondary | ICD-10-CM

## 2015-03-26 DIAGNOSIS — R5383 Other fatigue: Secondary | ICD-10-CM | POA: Diagnosis not present

## 2015-03-26 DIAGNOSIS — N898 Other specified noninflammatory disorders of vagina: Secondary | ICD-10-CM

## 2015-03-26 LAB — TSH: TSH: 0.78 u[IU]/mL (ref 0.35–4.50)

## 2015-03-26 LAB — CBC WITH DIFFERENTIAL/PLATELET
BASOS PCT: 0.4 % (ref 0.0–3.0)
Basophils Absolute: 0 10*3/uL (ref 0.0–0.1)
Eosinophils Absolute: 0.3 10*3/uL (ref 0.0–0.7)
Eosinophils Relative: 5.1 % — ABNORMAL HIGH (ref 0.0–5.0)
HCT: 38.5 % (ref 36.0–46.0)
Hemoglobin: 12.7 g/dL (ref 12.0–15.0)
Lymphocytes Relative: 32.8 % (ref 12.0–46.0)
Lymphs Abs: 1.8 10*3/uL (ref 0.7–4.0)
MCHC: 32.9 g/dL (ref 30.0–36.0)
MCV: 84.3 fl (ref 78.0–100.0)
Monocytes Absolute: 0.6 10*3/uL (ref 0.1–1.0)
Monocytes Relative: 10 % (ref 3.0–12.0)
Neutro Abs: 2.9 10*3/uL (ref 1.4–7.7)
Neutrophils Relative %: 51.7 % (ref 43.0–77.0)
Platelets: 266 10*3/uL (ref 150.0–400.0)
RBC: 4.57 Mil/uL (ref 3.87–5.11)
RDW: 13.5 % (ref 11.5–15.5)
WBC: 5.5 10*3/uL (ref 4.0–10.5)

## 2015-03-26 LAB — POCT URINE PREGNANCY: PREG TEST UR: NEGATIVE

## 2015-03-26 LAB — VITAMIN D 25 HYDROXY (VIT D DEFICIENCY, FRACTURES): VITD: 25.24 ng/mL — ABNORMAL LOW (ref 30.00–100.00)

## 2015-03-26 MED ORDER — NYSTATIN 100000 UNIT/GM EX CREA: 1.0000 "application " | TOPICAL_CREAM | Freq: Two times a day (BID) | CUTANEOUS | Status: DC

## 2015-03-26 NOTE — Addendum Note (Signed)
Addended by: Kristian CoveyBURCHETTE, Elsia Lasota W on: 03/26/2015 03:30 PM   Modules accepted: Orders

## 2015-03-26 NOTE — Patient Instructions (Signed)
Keep area as dry as possible May use nystatin cream twice daily Touch base in 2 weeks if not seeing further improvement

## 2015-03-26 NOTE — Progress Notes (Signed)
Pre visit review using our clinic review tool, if applicable. No additional management support is needed unless otherwise documented below in the visit note. 

## 2015-03-26 NOTE — Progress Notes (Addendum)
   Subjective:    Patient ID: Julia Ruiz, female    DOB: 11/14/1985, 29 y.o.   MRN: [id number]  HPI Patient seen with vaginal itching. Present for about a month. Similar problem in the past. She once was prescribed Clindagel which she felt made the symptoms worse. She felt this may be related to defer washing detergent but she has made a change recently and symptoms have not resolved. No dysuria. No vaginal discharge. No fevers or chills. She does not have any internal symptoms and these are mostly external vagina  Past Medical History  Diagnosis Date  . Abnormal cervical Papanicolaou smear     as teenager, normal since per her report   Past Surgical History  Procedure Laterality Date  . Cesarean section      reports that she has never smoked. She does not have any smokeless tobacco history on file. She reports that she drinks alcohol. She reports that she does not use illicit drugs. family history includes Mental illness in her brother. Allergies  Allergen Reactions  . Clindagel [Clindamycin] Itching  . Tetracyclines & Related Other (See Comments)    wheezing      Review of Systems  Constitutional: Negative for fever and chills.  Genitourinary: Negative for dysuria.       Objective:   Physical Exam  Constitutional: She appears well-developed and well-nourished.  Cardiovascular: Normal rate and regular rhythm.   Pulmonary/Chest: Effort normal and breath sounds normal. No respiratory distress. She has no wheezes. She has no rales.  Skin:  Minimal erythema labia majora region. No lesions. No pustules. No vesicles. Nonscaly.          Assessment & Plan:  Vaginal pruritus. Question candida component. Nystatin cream twice daily. Consider repeat oral fluconazole if not improving with the above  Pt also c/o fatigue and requesting pregnancy test and labs to evaluate her fatigue.  She is not yet late on her menses.  Check urine pregnancy and TSH, CBC, VIt D

## 2015-03-27 MED ORDER — VITAMIN D (ERGOCALCIFEROL) 1.25 MG (50000 UNIT) PO CAPS: 50000.0000 [IU] | ORAL_CAPSULE | ORAL | Status: DC

## 2015-03-27 NOTE — Addendum Note (Signed)
Addended by: Azucena Freed on: 03/27/2015 03:49 PM   Modules accepted: Orders

## 2015-04-22 ENCOUNTER — Ambulatory Visit (INDEPENDENT_AMBULATORY_CARE_PROVIDER_SITE_OTHER): Payer: 59 | Admitting: Family Medicine

## 2015-04-22 ENCOUNTER — Other Ambulatory Visit (HOSPITAL_COMMUNITY)
Admission: RE | Admit: 2015-04-22 | Discharge: 2015-04-22 | Disposition: A | Discharge status: Home / Self Care | Payer: 59 | Source: Ambulatory Visit | Attending: Family Medicine | Admitting: Family Medicine

## 2015-04-22 ENCOUNTER — Encounter: Payer: Self-pay | Admitting: Family Medicine

## 2015-04-22 VITALS — BP 96/70 | HR 91 | Temp 98.6°F | Ht 66.5 in | Wt 214.5 lb

## 2015-04-22 DIAGNOSIS — N898 Other specified noninflammatory disorders of vagina: Secondary | ICD-10-CM

## 2015-04-22 DIAGNOSIS — N760 Acute vaginitis: Secondary | ICD-10-CM | POA: Diagnosis present

## 2015-04-22 DIAGNOSIS — Z113 Encounter for screening for infections with a predominantly sexual mode of transmission: Secondary | ICD-10-CM | POA: Insufficient documentation

## 2015-04-22 NOTE — Progress Notes (Signed)
Pre visit review using our clinic review tool, if applicable. No additional management support is needed unless otherwise documented below in the visit note. 

## 2015-04-22 NOTE — Patient Instructions (Signed)
-  We have ordered labs or studies at this visit. It can take up to 1-2 weeks for results and processing. We will contact you with instructions IF your results are abnormal. Normal results will be released to your MYCHART. If you have not heard from us or can not find your results in MYCHART in 2 weeks please contact our office.        

## 2015-04-22 NOTE — Progress Notes (Signed)
  HPI:  Vaginal Discharge: -mild, light color, mild odor -no vulvovag pruritis, nausea, vomiting, diarrhea, dysuria, pelvic or abd pain -no worried about STI -no abx recently  ROS: See pertinent positives and negatives per HPI.  Past Medical History  Diagnosis Date  . Abnormal cervical Papanicolaou smear     as teenager, normal since per her report    Past Surgical History  Procedure Laterality Date  . Cesarean section      Family History  Problem Relation Age of Onset  . Mental illness Brother     History   Social History  . Marital Status: Married    Spouse Name: N/A  . Number of Children: N/A  . Years of Education: N/A   Social History Main Topics  . Smoking status: Never Smoker   . Smokeless tobacco: Not on file  . Alcohol Use: Yes     Comment: 2 glasees of wine occ  . Drug Use: No  . Sexual Activity: Not on file   Other Topics Concern  . 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  Multiple Vitamin (MULTIVITAMINS PO), Take by mouth., Disp: , Rfl:  .  Vitamin D, Ergocalciferol, (DRISDOL) 50000 UNITS CAPS capsule, Take 1 capsule (50,000 Units total) by mouth every 7 (seven) days., Disp: 12 capsule, Rfl: 0  EXAM:  Filed Vitals:   04/22/15 1333  BP: 96/70  Pulse: 91  Temp: 98.6 F (37 C)    Body mass index is 34.11 kg/(m^2).  GENERAL: vitals reviewed and listed above, alert, oriented, appears well hydrated and in no acute distress  HEENT: atraumatic, conjunttiva clear, no obvious abnormalities on inspection of external nose and ears  NECK: no obvious masses on inspection  ABD: soft, NTTP  GU: normal appearance of external genitalia, white homogenous discharge, no other abnormality on speculum exam, no CMT  MS: moves all  extremities without noticeable abnormality  PSYCH: pleasant and cooperative, no obvious depression or anxiety  ASSESSMENT AND PLAN:  Discussed the following assessment and plan:  Vaginal discharge - Plan: Cervicovaginal ancillary only  -wet prep, GC/Chlam, trich - tx per findings -Patient advised to return or notify a doctor immediately if symptoms worsen or persist or new concerns arise.  Patient Instructions  -We have ordered labs or studies at this visit. It can take up to 1-2 weeks for results and processing. We will contact you with instructions IF your results are abnormal. Normal results will be [facility] to your [facility]. If you have not heard from us or can not find your results in [facility] in 2 weeks please contact our office.            Kriste BasqueKIM, HANNAH R.

## 2015-04-23 LAB — CERVICOVAGINAL ANCILLARY ONLY
CHLAMYDIA, DNA PROBE: NEGATIVE
NEISSERIA GONORRHEA: NEGATIVE
Trichomonas: NEGATIVE

## 2015-04-25 LAB — CERVICOVAGINAL ANCILLARY ONLY: CANDIDA VAGINITIS: NEGATIVE

## 2015-04-28 MED ORDER — METRONIDAZOLE 500 MG PO TABS: 500.0000 mg | ORAL_TABLET | Freq: Two times a day (BID) | ORAL | Status: DC

## 2015-04-28 NOTE — Addendum Note (Signed)
Addended by: Johnella MoloneyFUNDERBURK, Darlynn Ricco A on: 04/28/2015 03:58 PM   Modules accepted: Orders

## 2015-05-13 ENCOUNTER — Encounter: Payer: Self-pay | Admitting: Family Medicine

## 2015-05-13 ENCOUNTER — Ambulatory Visit (INDEPENDENT_AMBULATORY_CARE_PROVIDER_SITE_OTHER): Payer: 59 | Admitting: Family Medicine

## 2015-05-13 VITALS — BP 100/72 | HR 100 | Temp 98.9°F | Ht 66.5 in | Wt 214.8 lb

## 2015-05-13 DIAGNOSIS — R300 Dysuria: Secondary | ICD-10-CM | POA: Diagnosis not present

## 2015-05-13 DIAGNOSIS — Z3200 Encounter for pregnancy test, result unknown: Secondary | ICD-10-CM | POA: Diagnosis not present

## 2015-05-13 LAB — POCT URINALYSIS DIPSTICK
Bilirubin, UA: NEGATIVE
Blood, UA: NEGATIVE
Glucose, UA: NEGATIVE
Ketones, UA: NEGATIVE
NITRITE UA: NEGATIVE
PROTEIN UA: NEGATIVE
Spec Grav, UA: 1.025
Urobilinogen, UA: 0.2
pH, UA: 6

## 2015-05-13 LAB — POCT URINE PREGNANCY: Preg Test, Ur: NEGATIVE

## 2015-05-13 NOTE — Progress Notes (Signed)
  HPI:  Acute visit for:  Dysuria: -started a few days ago -symptoms: urgency, frequency, mild dysuria -denies: fevers, NVD, flank pain, hematuria, vaginal is charge, concern for STI, abd or pelvic pain -hx of UTI and this feels the same -FDLMP: 04/08/2015 -is having unprotected sex  ROS: See pertinent positives and negatives per HPI.  Past Medical History  Diagnosis Date  . Abnormal cervical Papanicolaou smear     as teenager, normal since per her report    Past Surgical History  Procedure Laterality Date  . Cesarean section      Family History  Problem Relation Age of Onset  . Mental illness Brother     History   Social History  . Marital Status: Married    Spouse Name: N/A  . Number of Children: N/A  . Years of Education: N/A   Social History Main Topics  . Smoking status: Never Smoker   . Smokeless tobacco: Not on file  . Alcohol Use: Yes     Comment: 2 glasees of wine occ  . Drug Use: No  . Sexual Activity: Not on file   Other Topics Concern  . 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  Phenazopyridine HCl (AZO TABS PO), Take by mouth., Disp: , Rfl:  .  Vitamin D, Ergocalciferol, (DRISDOL) 50000 UNITS CAPS capsule, Take 1 capsule (50,000 Units total) by mouth every 7 (seven) days., Disp: 12 capsule, Rfl: 0  EXAM:  Filed Vitals:   05/13/15 1610  BP: 100/72  Pulse: 100  Temp: 98.9 F (37.2 C)    Body mass index is 34.15 kg/(m^2).  GENERAL: vitals reviewed and listed above, alert, oriented, appears well hydrated and in no acute distress  HEENT: atraumatic, conjunttiva clear, no obvious abnormalities on inspection of external nose and ears  NECK: no  obvious masses on inspection  LUNGS: clear to auscultation bilaterally, no wheezes, rales or rhonchi, good air movement  CV: HRRR, no peripheral edema  MS: moves all extremities without noticeable abnormality  PSYCH: pleasant and cooperative, no obvious depression or anxiety  ASSESSMENT AND PLAN:  Discussed the following assessment and plan:  Dysuria - Plan: POC Urinalysis Dipstick, Culture, Urine  Pregnancy examination or test, pregnancy unconfirmed - Plan: POCT urine pregnancy  -upreg and udip unrevealing, culture pedning, f/u if symptoms persist -Patient advised to return or notify a doctor immediately if symptoms worsen or persist or new concerns arise.  There are no Patient Instructions on file for this visit.   Kriste Basque R.

## 2015-05-13 NOTE — Progress Notes (Signed)
Pre visit review using our clinic review tool, if applicable. No additional management support is needed unless otherwise documented below in the visit note. 

## 2015-05-15 ENCOUNTER — Other Ambulatory Visit: Payer: Self-pay | Admitting: *Deleted

## 2015-05-15 DIAGNOSIS — R300 Dysuria: Secondary | ICD-10-CM

## 2015-05-15 LAB — URINE CULTURE

## 2015-05-19 ENCOUNTER — Other Ambulatory Visit: Payer: 59

## 2015-07-04 ENCOUNTER — Ambulatory Visit (INDEPENDENT_AMBULATORY_CARE_PROVIDER_SITE_OTHER): Payer: 59 | Admitting: Family Medicine

## 2015-07-04 DIAGNOSIS — R69 Illness, unspecified: Secondary | ICD-10-CM

## 2015-07-04 NOTE — Progress Notes (Signed)
NO SHOW for CPE  

## 2015-07-11 ENCOUNTER — Ambulatory Visit (INDEPENDENT_AMBULATORY_CARE_PROVIDER_SITE_OTHER): Payer: 59 | Admitting: Family Medicine

## 2015-07-11 ENCOUNTER — Encounter: Payer: Self-pay | Admitting: Family Medicine

## 2015-07-11 VITALS — BP 118/78 | HR 84 | Temp 98.8°F | Ht 66.5 in | Wt 207.6 lb

## 2015-07-11 DIAGNOSIS — Z3200 Encounter for pregnancy test, result unknown: Secondary | ICD-10-CM | POA: Diagnosis not present

## 2015-07-11 DIAGNOSIS — R300 Dysuria: Secondary | ICD-10-CM | POA: Diagnosis not present

## 2015-07-11 DIAGNOSIS — Z23 Encounter for immunization: Secondary | ICD-10-CM | POA: Diagnosis not present

## 2015-07-11 LAB — POCT URINALYSIS DIPSTICK
BILIRUBIN UA: NEGATIVE
GLUCOSE UA: NEGATIVE
Ketones, UA: NEGATIVE
Nitrite, UA: NEGATIVE
Protein, UA: NEGATIVE
Urobilinogen, UA: 0.2
pH, UA: 6

## 2015-07-11 LAB — URINALYSIS, MICROSCOPIC ONLY

## 2015-07-11 LAB — POCT URINE PREGNANCY: PREG TEST UR: NEGATIVE

## 2015-07-11 MED ORDER — NITROFURANTOIN MONOHYD MACRO 100 MG PO CAPS: 100.0000 mg | ORAL_CAPSULE | Freq: Two times a day (BID) | ORAL | Status: DC

## 2015-07-11 NOTE — Progress Notes (Signed)
Pre visit review using our clinic review tool, if applicable. No additional management support is needed unless otherwise documented below in the visit note. 

## 2015-07-11 NOTE — Progress Notes (Signed)
  HPI:  Acute visit for:  Dysuria: -started today -period exactly 28 days ago -mild dysuria and a little blood when wiped -denies: fevers, chills, NVD, flank pain, vag symptoms  ROS: See pertinent positives and negatives per HPI.  Past Medical History  Diagnosis Date  . Abnormal cervical Papanicolaou smear     as teenager, normal since per her report    Past Surgical History  Procedure Laterality Date  . Cesarean section      Family History  Problem Relation Age of Onset  . Mental illness Brother     Social History   Social History  . Marital Status: Married    Spouse Name: N/A  . Number of Children: N/A  . Years of Education: N/A   Social History Main Topics  . Smoking status: Never Smoker   . Smokeless tobacco: None  . Alcohol Use: Yes     Comment: 2 glasees of wine occ  . Drug Use: No  . Sexual Activity: Not Asked   Other Topics Concern  . 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  Phenazopyridine HCl (AZO TABS PO), Take by mouth., Disp: , Rfl:  .  nitrofurantoin, macrocrystal-monohydrate, (MACROBID) 100 MG capsule, Take 1 capsule (100 mg total) by mouth 2 (two) times daily., Disp: 15 capsule, Rfl: 0  EXAM:  Filed Vitals:   07/11/15 1458  BP: 118/78  Pulse: 84  Temp: 98.8 F (37.1 C)    Body mass index is 33.01 kg/(m^2).  GENERAL: vitals reviewed and listed above, alert, oriented, appears well hydrated and in no acute distress  HEENT: atraumatic, conjunttiva clear, no obvious abnormalities on inspection of external nose and ears  NECK: no obvious masses on inspection  LUNGS: clear to auscultation bilaterally, no wheezes, rales or rhonchi, good air  movement  CV: HRRR, no peripheral edema  ABD: BS+, soft, NTTP, no flank pain  MS: moves all extremities without noticeable abnormality  PSYCH: pleasant and cooperative, no obvious depression or anxiety  ASSESSMENT AND PLAN:  Discussed the following assessment and plan:  Dysuria - Plan: POC Urinalysis Dipstick, Culture, Urine, Urine Microscopic Only  -blood and leuks on udip - could be related to period which is more likely source of blood when she wiped -will get culture and give abx in case symptoms persist or worsen over weekend -also unsure if her collection methods are good - would advise repeat collection using good practices 1 week after end of period if any persist symptoms or if etiology unclear -Patient advised to return or notify a doctor immediately if symptoms worsen or persist or new concerns arise.  There are no Patient Instructions on file for this visit.   Kriste Basque R.

## 2015-07-11 NOTE — Addendum Note (Signed)
Addended by: Johnella Moloney on: 07/11/2015 03:25 PM   Modules accepted: Orders

## 2015-07-13 LAB — URINE CULTURE

## 2015-07-14 ENCOUNTER — Other Ambulatory Visit: Payer: Self-pay | Admitting: *Deleted

## 2015-07-14 DIAGNOSIS — R300 Dysuria: Secondary | ICD-10-CM

## 2015-07-25 ENCOUNTER — Other Ambulatory Visit: Payer: 59

## 2015-07-29 ENCOUNTER — Other Ambulatory Visit (INDEPENDENT_AMBULATORY_CARE_PROVIDER_SITE_OTHER): Payer: 59

## 2015-07-29 DIAGNOSIS — R300 Dysuria: Secondary | ICD-10-CM

## 2015-07-29 LAB — POCT URINALYSIS DIPSTICK
BILIRUBIN UA: NEGATIVE
Blood, UA: NEGATIVE
Glucose, UA: NEGATIVE
KETONES UA: NEGATIVE
LEUKOCYTES UA: NEGATIVE
Nitrite, UA: NEGATIVE
PH UA: 7
Protein, UA: NEGATIVE
SPEC GRAV UA: 1.025
Urobilinogen, UA: 0.2

## 2015-07-30 LAB — URINALYSIS, ROUTINE W REFLEX MICROSCOPIC
Bilirubin Urine: NEGATIVE
Hgb urine dipstick: NEGATIVE
Ketones, ur: NEGATIVE
Leukocytes, UA: NEGATIVE
Nitrite: NEGATIVE
Specific Gravity, Urine: 1.02 (ref 1.000–1.030)
Total Protein, Urine: NEGATIVE
Urine Glucose: NEGATIVE
Urobilinogen, UA: 0.2 (ref 0.0–1.0)
pH: 6.5 (ref 5.0–8.0)

## 2015-07-31 LAB — URINE CULTURE

## 2015-08-01 MED ORDER — LEVOFLOXACIN 500 MG PO TABS: 500.0000 mg | ORAL_TABLET | Freq: Every day | ORAL | Status: DC

## 2015-09-03 ENCOUNTER — Encounter: Payer: Self-pay | Admitting: Family Medicine

## 2015-09-03 ENCOUNTER — Ambulatory Visit (INDEPENDENT_AMBULATORY_CARE_PROVIDER_SITE_OTHER): Payer: 59 | Admitting: Family Medicine

## 2015-09-03 VITALS — BP 100/74 | HR 77 | Temp 98.3°F | Ht 66.25 in | Wt 203.7 lb

## 2015-09-03 DIAGNOSIS — Z0000 Encounter for general adult medical examination without abnormal findings: Secondary | ICD-10-CM

## 2015-09-03 LAB — LIPID PANEL
CHOL/HDL RATIO: 3
Cholesterol: 147 mg/dL (ref 0–200)
HDL: 42.1 mg/dL (ref >39.00)
LDL CALC: 92 mg/dL (ref 0–99)
NONHDL: 105.06
TRIGLYCERIDES: 63 mg/dL (ref 0.0–149.0)
VLDL: 12.6 mg/dL (ref 0.0–40.0)

## 2015-09-03 LAB — HEMOGLOBIN A1C: Hgb A1c MFr Bld: 5.1 % (ref 4.6–6.5)

## 2015-09-03 NOTE — Progress Notes (Signed)
Pre visit review using our clinic review tool, if applicable. No additional management support is needed unless otherwise documented below in the visit note. 

## 2015-09-03 NOTE — Progress Notes (Signed)
HPI:  Here for CPE:  -Concerns and/or follow up today: none  -Diet: variety of foods, balance and well rounded, larger portion sizes  -Exercise: no regular exercise  -Taking folic acid, vitamin D or calcium: yes  -Diabetes and Dyslipidemia Screening: FASTING  -Hx of HTN: no  -Vaccines: UTD  -pap history: 06/2014 normal, hx remote abnormal pap as teenager but reports yearly paps all normal since  -FDLMP: Aug 10, 2015, regular and normal  -sexual activity: yes, female partner, no new partners  -wants STI testing (Hep C if born [birth year]): no  -Alcohol, Tobacco, drug use: see social history  Review of Systems - no fevers, unintentional weight loss, vision loss, hearing loss, chest pain, sob, hemoptysis, melena, hematochezia, hematuria, genital discharge, changing or concerning skin lesions, bleeding, bruising, loc, thoughts of self harm or SI  Past Medical History  Diagnosis Date  . Abnormal cervical Papanicolaou smear     as teenager, normal since per her report    Past Surgical History  Procedure Laterality Date  . Cesarean section      Family History  Problem Relation Age of Onset  . Mental illness Brother     Social History   Social History  . Marital Status: Married    Spouse Name: N/A  . Number of Children: N/A  . Years of Education: N/A   Social History Main Topics  . Smoking status: Never Smoker   . Smokeless tobacco: None  . Alcohol Use: Yes     Comment: 2 glasees of wine occ  . Drug Use: No  . Sexual Activity: Not Asked   Other Topics Concern  . 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 moist mucus membranes.  NECK: supple, no masses or lymphadenopathy  LUNGS: clear to auscultation bilaterally, no rales, rhonchi or wheeze  CV: HRRR, no peripheral edema or cyanosis, normal pedal pulses  BREAST: normal appearance - no lesions or discharge, on palpation normal breast tissue without any suspicious masses  ABDOMEN: bowel sounds normal, soft, non tender to palpation, no masses, no rebound or guarding  GU: declined  SKIN: no rash or abnormal lesions  MS: normal gait, moves all extremities normally  NEURO: CN II-XII grossly intact, normal muscle strength and sensation to light touch on extremities  PSYCH: normal affect, pleasant and cooperative  ASSESSMENT AND PLAN:  Discussed the following assessment and plan:  Visit for preventive health examination - Plan: Lipid Panel, Hemoglobin A1c  -Discussed and advised all [place] preventive services health task force level A and B recommendations for age, sex and risks.  -Advised at least 150 minutes of exercise per week and a healthy diet low in saturated fats and sweets and consisting of fresh fruits and vegetables, lean meats such as fish and white chicken and whole grains.  -she had vaginal/pelvic exam recently, she  reports yearly all normal paps for > 10 years and we have a normal pap from last year, discussed new screening guidelines and she opted to hold off on pap this year, advised could to pap with hpv next year  -FASTING labs, studies and vaccines per orders this encounter  Orders Placed This Encounter  Procedures  . Lipid Panel  . Hemoglobin A1c    Patient advised to return to clinic immediately if symptoms worsen or persist or new concerns.  Patient Instructions  BEFORE YOU  LEAVE: -labs -schedule physical with pap in 1 year  -We have ordered labs or studies at this visit. It can take up to 1-2 weeks for results and processing. We will contact you with instructions IF your results are abnormal. Normal results will be released to your [facility]. If you have not heard from us or can not find your results in [facility] in 2 weeks please contact our office.  We recommend the following healthy lifestyle measures: - eat a healthy whole foods diet consisting of regular small meals composed of vegetables, fruits, beans, nuts, seeds, healthy meats such as white chicken and fish and whole grains.  - avoid sweets, white starchy foods, fried foods, fast food, processed foods, sodas, red meet and other fattening foods.  - get a least 150-300 minutes of aerobic exercise per week.           No Follow-up on file.  Kriste BasqueKIM, HANNAH R.

## 2015-09-03 NOTE — Patient Instructions (Signed)
BEFORE YOU LEAVE: -labs -schedule physical with pap in 1 year  -We have ordered labs or studies at this visit. It can take up to 1-2 weeks for results and processing. We will contact you with instructions IF your results are abnormal. Normal results will be released to your [facility]. If you have not heard from us or can not find your results in [facility] in 2 weeks please contact our office.  We recommend the following healthy lifestyle measures: - eat a healthy whole foods diet consisting of regular small meals composed of vegetables, fruits, beans, nuts, seeds, healthy meats such as white chicken and fish and whole grains.  - avoid sweets, white starchy foods, fried foods, fast food, processed foods, sodas, red meet and other fattening foods.  - get a least 150-300 minutes of aerobic exercise per week.

## 2015-10-23 ENCOUNTER — Ambulatory Visit (INDEPENDENT_AMBULATORY_CARE_PROVIDER_SITE_OTHER): Payer: 59 | Admitting: Family Medicine

## 2015-10-23 ENCOUNTER — Encounter: Payer: Self-pay | Admitting: Family Medicine

## 2015-10-23 VITALS — BP 118/82 | HR 80 | Temp 98.0°F | Ht 66.25 in | Wt 205.3 lb

## 2015-10-23 DIAGNOSIS — J020 Streptococcal pharyngitis: Secondary | ICD-10-CM | POA: Diagnosis not present

## 2015-10-23 MED ORDER — AMOXICILLIN 500 MG PO CAPS: 500.0000 mg | ORAL_CAPSULE | Freq: Two times a day (BID) | ORAL | Status: DC

## 2015-10-23 NOTE — Progress Notes (Signed)
Pre visit review using our clinic review tool, if applicable. No additional management support is needed unless otherwise documented below in the visit note. 

## 2015-10-23 NOTE — Progress Notes (Signed)
  HPI:  Sore throat: -for about 5-7 days -now improving -but, was very sick with very bad sore throat, swollen tonsils, fatigue, malaise -kids both had this too and earlier this week they both had confirmed strep -she took a few days of keflex she had at home and now is feeling much better -denies: fevers, dysphagia, SOB, cough, nasal congestion  ROS: See pertinent positives and negatives per HPI.  Past Medical History  Diagnosis Date  . Abnormal cervical Papanicolaou smear     as teenager, normal since per her report    Past Surgical History  Procedure Laterality Date  . Cesarean section      Family History  Problem Relation Age of Onset  . Mental illness Brother     Social History   Social History  . Marital Status: Married    Spouse Name: N/A  . Number of Children: N/A  . Years of Education: N/A   Social History Main Topics  . Smoking status: Never Smoker   . Smokeless tobacco: None  . Alcohol Use: Yes     Comment: 2 glasees of wine occ  . Drug Use: No  . Sexual Activity: Not Asked   Other Topics Concern  . 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  amoxicillin (AMOXIL) 500 MG capsule, Take 1 capsule (500 mg total) by mouth 2 (two) times daily., Disp: 20 capsule, Rfl: 0  EXAM:  Filed Vitals:   10/23/15 1450  BP: 118/82  Pulse: 80  Temp: 98 F (36.7 C)    Body mass index is 32.88 kg/(m^2).  GENERAL: vitals reviewed and listed above, alert, oriented, appears well hydrated and in no acute distress  HEENT: atraumatic, conjunttiva clear, no obvious abnormalities on inspection of external nose and ears, normal appearance of ear canals and TMs, normal nasal membranes, mild post oropharyngeal erythema with 1+tondillar edema,, no sinus  TTP  NECK: no obvious masses on inspection - shoty ant cervical adenopathy mild  LUNGS: clear to auscultation bilaterally, no wheezes, rales or rhonchi, good air movement  CV: HRRR, no peripheral edema  MS: moves all extremities without noticeable abnormality  PSYCH: pleasant and cooperative, no obvious depression or anxiety  ASSESSMENT AND PLAN:  Discussed the following assessment and plan:  Streptococcal sore throat  -strep pharyngitis very likely, opted to treat empirically given timing  -Patient advised to return or notify a doctor immediately if symptoms worsen or persist or new concerns arise.  There are no Patient Instructions on file for this visit.   Kriste BasqueKIM, Maddix Kliewer R.

## 2015-12-05 ENCOUNTER — Encounter (HOSPITAL_COMMUNITY): Payer: Self-pay | Admitting: Emergency Medicine

## 2016-06-18 DIAGNOSIS — H5201 Hypermetropia, right eye: Secondary | ICD-10-CM | POA: Diagnosis not present

## 2016-06-18 DIAGNOSIS — H52223 Regular astigmatism, bilateral: Secondary | ICD-10-CM | POA: Diagnosis not present

## 2016-07-02 ENCOUNTER — Encounter: Payer: Self-pay | Admitting: Family Medicine

## 2016-07-02 ENCOUNTER — Ambulatory Visit (INDEPENDENT_AMBULATORY_CARE_PROVIDER_SITE_OTHER): Payer: 59 | Admitting: Family Medicine

## 2016-07-02 VITALS — BP 118/76 | HR 87 | Temp 98.2°F | Ht 66.25 in | Wt 204.3 lb

## 2016-07-02 DIAGNOSIS — Z3200 Encounter for pregnancy test, result unknown: Secondary | ICD-10-CM

## 2016-07-02 DIAGNOSIS — R300 Dysuria: Secondary | ICD-10-CM | POA: Diagnosis not present

## 2016-07-02 LAB — POCT URINALYSIS DIPSTICK
Bilirubin, UA: NEGATIVE
Glucose, UA: NEGATIVE
Ketones, UA: NEGATIVE
NITRITE UA: POSITIVE
Protein, UA: NEGATIVE
SPEC GRAV UA: 1.025
UROBILINOGEN UA: 0.2
pH, UA: 5

## 2016-07-02 LAB — POCT URINE PREGNANCY: PREG TEST UR: NEGATIVE

## 2016-07-02 MED ORDER — NITROFURANTOIN MONOHYD MACRO 100 MG PO CAPS: 100.0000 mg | ORAL_CAPSULE | Freq: Two times a day (BID) | ORAL | 0 refills | Status: DC

## 2016-07-02 NOTE — Progress Notes (Signed)
Pre visit review using our clinic review tool, if applicable. No additional management support is needed unless otherwise documented below in the visit note. 

## 2016-07-02 NOTE — Progress Notes (Signed)
  HPI:  Acute visit for:  Dysuria: -x1 day -symtpoms include burning with urination, odor to urine -denies: fevers, malaise, flank pain, vomiting, diarrhea, vag discharge  -FDLMP 06/19/16  ROS: See pertinent positives and negatives per HPI.  Past Medical History:  Diagnosis Date  . Abnormal cervical Papanicolaou smear    as teenager, normal since per her report    Past Surgical History:  Procedure Laterality Date  . CESAREAN SECTION    . CESAREAN SECTION      Family History  Problem Relation Age of Onset  . Mental illness Brother     Social History   Social History  . Marital status: Married    Spouse name: N/A  . Number of children: N/A  . Years of education: N/A   Social History Main Topics  . Smoking status: Never Smoker  . Smokeless tobacco: None  . Alcohol use Yes     Comment: 2 glasees of wine occ  . Drug use: No  . Sexual activity: Not Asked   Other Topics Concern  . 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  nitrofurantoin, macrocrystal-monohydrate, (MACROBID) 100 MG capsule, Take 1 capsule (100 mg total) by mouth 2 (two) times daily., Disp: 14 capsule, Rfl: 0  EXAM:  Vitals:   07/02/16 1105  BP: 118/76  Pulse: 87  Temp: 98.2 F (36.8 C)    Body mass index is 32.73 kg/m.  GENERAL: vitals reviewed and listed above, alert, oriented, appears well hydrated and in no acute distress  HEENT: atraumatic, conjunttiva clear, no obvious abnormalities on inspection of external nose and ears  NECK: no obvious masses on inspection  LUNGS: clear to auscultation bilaterally, no wheezes, rales or rhonchi, good air movement  CV: HRRR, no peripheral edema  MS: moves all extremities without  noticeable abnormality  PSYCH: pleasant and cooperative, no obvious depression or anxiety  ASSESSMENT AND PLAN:  Discussed the following assessment and plan:  Dysuria - Plan: POC Urinalysis Dipstick, Culture, Urine  Pregnancy examination or test, pregnancy unconfirmed - Plan: POCT urine pregnancy  -udip suggest uti, opted for empiric abx -pt requested preg test, negative -Patient advised to return or notify a doctor immediately if symptoms worsen or persist or new concerns arise.  There are no Patient Instructions on file for this visit.  Kriste BasqueKIM, Tona Qualley R., DO

## 2016-07-05 LAB — URINE CULTURE

## 2016-07-08 ENCOUNTER — Telehealth: Payer: Self-pay | Admitting: Family Medicine

## 2016-07-08 ENCOUNTER — Telehealth: Payer: Self-pay | Admitting: *Deleted

## 2016-07-08 NOTE — Telephone Encounter (Signed)
Patient Name: Julia Ruiz  DOB: 05/25/1986    Initial Comment Caller states is trying to get in touch with a nurse, seen for a uti, had a culture done, on chart states E-Coli and is not sure if she has a uti.    Nurse Assessment  Nurse: Julia Bakeosta, RN, Julia Ruiz Date/Time Lamount Cohen(Eastern Time): 07/08/2016 4:10:08 PM  Confirm and document reason for call. If symptomatic, describe symptoms. You must click the next button to save text entered. ---Caller states she started Macrobid on the 11th for blood in the urine. She checked her MyChart today and saw that her UA was positive for E.Coli. She is getting ready to start her last pill of Macrobid. No longer having any urine symptoms at this time. She will be back in town Monday. Requesting a repeat UA to make sure the urine is gone.  Has the patient traveled out of the country within the last 30 days? ---Not Applicable  Does the patient have any new or worsening symptoms? ---No     Guidelines    Guideline Title Affirmed Question Affirmed Notes       Final Disposition User        Comments  Warm transferred Mane to speak with her doctors nurse.   Referrals  REFERRED TO PCP OFFICE

## 2016-07-08 NOTE — Telephone Encounter (Signed)
[facility] called stating the pt is concerned about her culture results after viewing this online and transferred the pt to me.  Patient stated she is feeling better and wanted to know what to do. The culture results were reviewed by Kandee Keenory and he stated Macrobid should work for the pts infection and she should complete the prescription.  I called the pt and informed her of this and she agreed.

## 2016-07-09 NOTE — Telephone Encounter (Signed)
I talked with the pt and results were discussed.  See phone message from 9/21.

## 2016-08-18 LAB — HM PAP SMEAR: HM Pap smear: NORMAL

## 2016-09-02 ENCOUNTER — Encounter: Payer: Self-pay | Admitting: Family Medicine

## 2016-09-02 ENCOUNTER — Ambulatory Visit (INDEPENDENT_AMBULATORY_CARE_PROVIDER_SITE_OTHER): Payer: 59 | Admitting: Family Medicine

## 2016-09-02 VITALS — BP 102/80 | HR 74 | Temp 98.2°F | Ht 67.25 in | Wt 206.0 lb

## 2016-09-02 DIAGNOSIS — Z0000 Encounter for general adult medical examination without abnormal findings: Secondary | ICD-10-CM

## 2016-09-02 DIAGNOSIS — Z6832 Body mass index (BMI) 32.0-32.9, adult: Secondary | ICD-10-CM | POA: Diagnosis not present

## 2016-09-02 LAB — LIPID PANEL
CHOL/HDL RATIO: 3
Cholesterol: 157 mg/dL (ref 0–200)
HDL: 44.9 mg/dL (ref >39.00)
LDL Cholesterol: 96 mg/dL (ref 0–99)
NONHDL: 111.92
Triglycerides: 78 mg/dL (ref 0.0–149.0)
VLDL: 15.6 mg/dL (ref 0.0–40.0)

## 2016-09-02 LAB — HEMOGLOBIN A1C: HEMOGLOBIN A1C: 5.1 % (ref 4.6–6.5)

## 2016-09-02 NOTE — Patient Instructions (Signed)
BEFORE YOU LEAVE: -follow up: CPE with pap in 1 year -labs  We have ordered labs or studies at this visit. It can take up to 1-2 weeks for results and processing. IF results require follow up or explanation, we will call you with instructions. Clinically stable results will be released to your [facility]. If you have not heard from us or cannot find your results in [facility] in 2 weeks please contact our office at [phone].   If you are not yet signed up for [facility], please SIGN UP TODAY. We now offer online scheduling, same day appointments and extended hours. WHEN YOU DON'T FEEL YOUR BEST.Marland Kitchen.Marland Kitchen.WE ARE HERE TO HELP.   We recommend the following healthy lifestyle for LIFE: 1) Small portions.   Tip: eat off of a salad plate instead of a dinner plate.  Tip: It is ok to feel hungry after a meal - that likely means you ate an appropriate portion.  Tip: if you need more or a snack choose fruits, veggies and/or a handful of nuts or seeds.  2) Eat a healthy clean diet.  * Tip: Avoid (less then 1 serving per week): processed foods, sweets, sweetened drinks, white starches (rice, flour, bread, potatoes, pasta, etc), red meat, fast foods, butter  *Tip: CHOOSE instead   * 5-9 servings per day of fresh or frozen fruits and vegetables (but not corn, potatoes, bananas, canned or dried fruit)   *nuts and seeds, beans   *olives and olive oil   *small portions of lean meats such as fish and white chicken    *small portions of whole grains  3)Get at least 150 minutes of sweaty aerobic exercise per week.  4)Reduce stress - consider counseling, meditation and relaxation to balance other aspects of your life.

## 2016-09-02 NOTE — Progress Notes (Signed)
Pre visit review using our clinic review tool, if applicable. No additional management support is needed unless otherwise documented below in the visit note. 

## 2016-09-02 NOTE — Progress Notes (Signed)
HPI:  Here for CPE:  -Concerns and/or follow up today: none  -Diet: variety of foods, balance and well rounded, larger portion sizes - doing fish and vegetarian diet  -Exercise: no regular exercise  -Taking folic acid, vitamin D or calcium: no  -Diabetes and Dyslipidemia Screening: fasting and wants to recheck  -Hx of HTN: no  -Vaccines: UTD  -pap history: 06/2014 - normal, no hx of abnormal pap  -FDLMP: 08/20/16 - every 25 days  -sexual activity: yes, female partner, no new partners  -wants STI testing (Hep C if born [birth year]): no  -FH breast, colon or ovarian ca: see FH Last mammogram: n/a Last colon cancer screening: n/a  -Alcohol, Tobacco, drug use: see social history  Review of Systems - no fevers, unintentional weight loss, vision loss, hearing loss, chest pain, sob, hemoptysis, melena, hematochezia, hematuria, genital discharge, changing or concerning skin lesions, bleeding, bruising, loc, thoughts of self harm or SI  Past Medical History:  Diagnosis Date  . Abnormal cervical Papanicolaou smear    as teenager, normal since per her report    Past Surgical History:  Procedure Laterality Date  . CESAREAN SECTION    . CESAREAN SECTION      Family History  Problem Relation Age of Onset  . Mental illness Brother     Social History   Social History  . Marital status: Married    Spouse name: N/A  . Number of children: N/A  . Years of education: N/A   Social History Main Topics  . Smoking status: Never Smoker  . Smokeless tobacco: None  . Alcohol use Yes     Comment: 2 glasees of wine occ  . Drug use: No  . Sexual activity: Not Asked   Other Topics Concern  . 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  EXAM:  Vitals:   09/02/16 1125  BP: 102/80  Pulse: 74  Temp: 98.2 F (36.8 C)   Body mass index is 32.02 kg/m.  GENERAL: vitals reviewed and listed below, alert, oriented, appears well hydrated and in no acute distress  HEENT: head atraumatic, PERRLA, normal appearance of eyes, ears, nose and mouth. moist mucus membranes.  NECK: supple, no masses or lymphadenopathy  LUNGS: clear to auscultation bilaterally, no rales, rhonchi or wheeze  CV: HRRR, no peripheral edema or cyanosis, normal pedal pulses  BREAST: normal appearance - no lesions or discharge, on palpation normal breast tissue without any suspicious masses  ABDOMEN: bowel sounds normal, soft, non tender to palpation, no masses, no rebound or guarding  GU: deferred, pap not due  SKIN: no rash or abnormal lesions  MS: normal gait, moves all extremities normally  NEURO: normal gait, speech and thought processing grossly intact, muscle tone grossly intact throughout  PSYCH: normal affect, pleasant and cooperative  ASSESSMENT AND PLAN:  Discussed the following assessment and plan:  Encounter for preventive health examination - Plan: Lipid Panel, Hemoglobin A1c  BMI 32.0-32.9,adult   -Discussed and advised all [place] preventive services health task force level A and B recommendations for age, sex and risks.  -Advised at least 150 minutes of exercise per week and a  healthy diet with avoidance of (less then 1 serving per week) processed foods, white starches, red meat, fast foods and sweets and consisting of: * 5-9 servings of fresh fruits and vegetables (not corn or potatoes) *nuts and seeds, beans *olives and olive oil *lean meats such as fish and white chicken  *whole grains  -labs, studies and vaccines per orders this encounter  Orders Placed This Encounter  Procedures  . Lipid Panel  . Hemoglobin A1c    Patient advised to return to clinic immediately if symptoms worsen or persist  or new concerns.  Patient Instructions  BEFORE YOU LEAVE: -follow up: CPE with pap in 1 year -labs  We have ordered labs or studies at this visit. It can take up to 1-2 weeks for results and processing. IF results require follow up or explanation, we will call you with instructions. Clinically stable results will be released to your [facility]. If you have not heard from us or cannot find your results in [facility] in 2 weeks please contact our office at [phone].   If you are not yet signed up for [facility], please SIGN UP TODAY. We now offer online scheduling, same day appointments and extended hours. WHEN YOU DON'T FEEL YOUR BEST.Marland Kitchen.Marland Kitchen.WE ARE HERE TO HELP.   We recommend the following healthy lifestyle for LIFE: 1) Small portions.   Tip: eat off of a salad plate instead of a dinner plate.  Tip: It is ok to feel hungry after a meal - that likely means you ate an appropriate portion.  Tip: if you need more or a snack choose fruits, veggies and/or a handful of nuts or seeds.  2) Eat a healthy clean diet.  * Tip: Avoid (less then 1 serving per week): processed foods, sweets, sweetened drinks, white starches (rice, flour, bread, potatoes, pasta, etc), red meat, fast foods, butter  *Tip: CHOOSE instead   * 5-9 servings per day of fresh or frozen fruits and vegetables (but not corn, potatoes, bananas, canned or dried fruit)   *nuts and seeds, beans   *olives and olive oil   *small portions of lean meats such as fish and white chicken    *small portions of whole grains  3)Get at least 150 minutes of sweaty aerobic exercise per week.  4)Reduce stress - consider counseling, meditation and relaxation to balance other aspects of your life.              No Follow-up on file.  Kriste BasqueKIM, Luz Burcher R., DO

## 2016-11-16 ENCOUNTER — Other Ambulatory Visit: Payer: Self-pay | Admitting: Obstetrics & Gynecology

## 2016-11-16 DIAGNOSIS — Z124 Encounter for screening for malignant neoplasm of cervix: Secondary | ICD-10-CM | POA: Diagnosis not present

## 2016-11-16 DIAGNOSIS — Z3480 Encounter for supervision of other normal pregnancy, unspecified trimester: Secondary | ICD-10-CM | POA: Diagnosis not present

## 2016-11-16 DIAGNOSIS — Z6834 Body mass index (BMI) 34.0-34.9, adult: Secondary | ICD-10-CM | POA: Diagnosis not present

## 2016-11-16 DIAGNOSIS — Z01419 Encounter for gynecological examination (general) (routine) without abnormal findings: Secondary | ICD-10-CM | POA: Diagnosis not present

## 2016-11-16 DIAGNOSIS — Z3201 Encounter for pregnancy test, result positive: Secondary | ICD-10-CM | POA: Diagnosis not present

## 2016-11-16 DIAGNOSIS — N925 Other specified irregular menstruation: Secondary | ICD-10-CM | POA: Diagnosis not present

## 2016-11-19 LAB — CYTOLOGY - PAP

## 2016-12-07 DIAGNOSIS — L0291 Cutaneous abscess, unspecified: Secondary | ICD-10-CM | POA: Diagnosis not present

## 2016-12-10 DIAGNOSIS — L0291 Cutaneous abscess, unspecified: Secondary | ICD-10-CM | POA: Diagnosis not present

## 2016-12-20 ENCOUNTER — Other Ambulatory Visit: Payer: Self-pay | Admitting: Obstetrics & Gynecology

## 2016-12-20 DIAGNOSIS — Z3682 Encounter for antenatal screening for nuchal translucency: Secondary | ICD-10-CM | POA: Diagnosis not present

## 2016-12-20 DIAGNOSIS — R87615 Unsatisfactory cytologic smear of cervix: Secondary | ICD-10-CM | POA: Diagnosis not present

## 2016-12-23 LAB — CYTOLOGY - PAP

## 2016-12-28 DIAGNOSIS — Z3480 Encounter for supervision of other normal pregnancy, unspecified trimester: Secondary | ICD-10-CM | POA: Diagnosis not present

## 2016-12-28 DIAGNOSIS — Z3682 Encounter for antenatal screening for nuchal translucency: Secondary | ICD-10-CM | POA: Diagnosis not present

## 2016-12-28 LAB — OB RESULTS CONSOLE HIV ANTIBODY (ROUTINE TESTING): HIV: NONREACTIVE

## 2016-12-28 LAB — OB RESULTS CONSOLE RUBELLA ANTIBODY, IGM: RUBELLA: IMMUNE

## 2016-12-28 LAB — OB RESULTS CONSOLE ABO/RH: RH Type: POSITIVE

## 2016-12-28 LAB — OB RESULTS CONSOLE ANTIBODY SCREEN: Antibody Screen: NEGATIVE

## 2016-12-28 LAB — OB RESULTS CONSOLE RPR: RPR: NONREACTIVE

## 2016-12-28 LAB — OB RESULTS CONSOLE GC/CHLAMYDIA
Chlamydia: NEGATIVE
Gonorrhea: NEGATIVE

## 2016-12-28 LAB — OB RESULTS CONSOLE HEPATITIS B SURFACE ANTIGEN: HEP B S AG: NEGATIVE

## 2017-01-19 DIAGNOSIS — Z3480 Encounter for supervision of other normal pregnancy, unspecified trimester: Secondary | ICD-10-CM | POA: Diagnosis not present

## 2017-02-16 DIAGNOSIS — Z3682 Encounter for antenatal screening for nuchal translucency: Secondary | ICD-10-CM | POA: Diagnosis not present

## 2017-03-03 DIAGNOSIS — Z3491 Encounter for supervision of normal pregnancy, unspecified, first trimester: Secondary | ICD-10-CM | POA: Diagnosis not present

## 2017-03-07 DIAGNOSIS — Z3482 Encounter for supervision of other normal pregnancy, second trimester: Secondary | ICD-10-CM | POA: Diagnosis not present

## 2017-03-07 DIAGNOSIS — Z3A23 23 weeks gestation of pregnancy: Secondary | ICD-10-CM | POA: Diagnosis not present

## 2017-03-07 DIAGNOSIS — L292 Pruritus vulvae: Secondary | ICD-10-CM | POA: Diagnosis not present

## 2017-03-22 DIAGNOSIS — Z8632 Personal history of gestational diabetes: Secondary | ICD-10-CM | POA: Diagnosis not present

## 2017-03-22 DIAGNOSIS — R42 Dizziness and giddiness: Secondary | ICD-10-CM | POA: Diagnosis not present

## 2017-03-22 DIAGNOSIS — Z369 Encounter for antenatal screening, unspecified: Secondary | ICD-10-CM | POA: Diagnosis not present

## 2017-04-19 DIAGNOSIS — Z3483 Encounter for supervision of other normal pregnancy, third trimester: Secondary | ICD-10-CM | POA: Diagnosis not present

## 2017-04-19 DIAGNOSIS — B373 Candidiasis of vulva and vagina: Secondary | ICD-10-CM | POA: Diagnosis not present

## 2017-04-19 DIAGNOSIS — Z9889 Other specified postprocedural states: Secondary | ICD-10-CM | POA: Diagnosis not present

## 2017-04-19 DIAGNOSIS — Z3A29 29 weeks gestation of pregnancy: Secondary | ICD-10-CM | POA: Diagnosis not present

## 2017-04-22 ENCOUNTER — Other Ambulatory Visit: Payer: Self-pay | Admitting: Obstetrics and Gynecology

## 2017-05-17 ENCOUNTER — Encounter: Payer: Self-pay | Admitting: Family Medicine

## 2017-05-30 DIAGNOSIS — O26849 Uterine size-date discrepancy, unspecified trimester: Secondary | ICD-10-CM | POA: Diagnosis not present

## 2017-05-30 DIAGNOSIS — Z3A34 34 weeks gestation of pregnancy: Secondary | ICD-10-CM | POA: Diagnosis not present

## 2017-06-06 DIAGNOSIS — Z369 Encounter for antenatal screening, unspecified: Secondary | ICD-10-CM | POA: Diagnosis not present

## 2017-06-06 DIAGNOSIS — Z3493 Encounter for supervision of normal pregnancy, unspecified, third trimester: Secondary | ICD-10-CM | POA: Diagnosis not present

## 2017-06-06 DIAGNOSIS — Z3A36 36 weeks gestation of pregnancy: Secondary | ICD-10-CM | POA: Diagnosis not present

## 2017-06-14 ENCOUNTER — Encounter (HOSPITAL_COMMUNITY): Payer: Self-pay

## 2017-06-14 ENCOUNTER — Telehealth (HOSPITAL_COMMUNITY): Payer: Self-pay | Admitting: *Deleted

## 2017-06-14 NOTE — Telephone Encounter (Signed)
Preadmission screen  

## 2017-06-16 ENCOUNTER — Encounter (HOSPITAL_COMMUNITY): Payer: Self-pay | Admitting: *Deleted

## 2017-06-16 ENCOUNTER — Telehealth (HOSPITAL_COMMUNITY): Payer: Self-pay | Admitting: *Deleted

## 2017-06-16 ENCOUNTER — Emergency Department (HOSPITAL_COMMUNITY)
Admission: EM | Admit: 2017-06-16 | Discharge: 2017-06-16 | Disposition: A | Discharge status: Home / Self Care | Payer: 59 | Attending: Emergency Medicine | Admitting: Emergency Medicine

## 2017-06-16 DIAGNOSIS — Z719 Counseling, unspecified: Secondary | ICD-10-CM | POA: Diagnosis not present

## 2017-06-16 DIAGNOSIS — Z203 Contact with and (suspected) exposure to rabies: Secondary | ICD-10-CM | POA: Diagnosis not present

## 2017-06-16 NOTE — Telephone Encounter (Signed)
Preadmission screen  

## 2017-06-16 NOTE — ED Provider Notes (Signed)
[facility]-EMERGENCY DEPT Provider Note   CSN: [id number] Arrival date & time: 06/16/17  2000     History   Chief Complaint Chief Complaint  Patient presents with  . possible rabies exposure    HPI Julia Ruiz is a 31 y.o. female.  Patient is a 31 year old female who presents to the emergency department after possible exposure to rabies.  The patient states that the family dog has been having some strange activity over the past 2 weeks. The symptoms seem to be related to frothy sputum, hyperactivity, and seizures. The dog has had at least 2 rabies vaccinations. The mother states that the dog licked her on the foot, and she had scratches from and bites on her foot when the dog licked her. The dog has not been evaluated for rabies at this point, the family will take the dog to have the dog tested on tomorrow.       Past Medical History:  Diagnosis Date  . Abnormal cervical Papanicolaou smear    as teenager, normal since per her report  . Gestational diabetes     Patient Active Problem List   Diagnosis Date Noted  . Obesity 04/04/2014    Past Surgical History:  Procedure Laterality Date  . CESAREAN SECTION    . CESAREAN SECTION      OB History    Gravida Para Term Preterm AB Living   3 2 2  0 0 2   SAB TAB Ectopic Multiple Live Births   0 0 0   2       Home Medications    Prior to Admission medications   Not on File    Family History Family History  Problem Relation Age of Onset  . Mental illness Brother   . Irritable bowel syndrome Mother     Social History Social History  Substance Use Topics  . Smoking status: Never Smoker  . Smokeless tobacco: Never Used  . Alcohol use No     Comment: 2 glasees of wine occ     Allergies   Tetracyclines & related; Clindagel [clindamycin]; and Tetracyclines & related   Review of Systems Review of Systems  Constitutional: Negative for activity change.       All ROS Neg except as noted in HPI  HENT: Negative  for nosebleeds.   Eyes: Negative for photophobia and discharge.  Respiratory: Negative for cough, shortness of breath and wheezing.   Cardiovascular: Negative for chest pain and palpitations.  Gastrointestinal: Negative for abdominal pain and blood in stool.  Genitourinary: Negative for dysuria, frequency and hematuria.  Musculoskeletal: Negative for arthralgias, back pain and neck pain.  Skin: Negative.   Neurological: Negative for dizziness, seizures and speech difficulty.  Psychiatric/Behavioral: Negative for confusion and hallucinations.     Physical Exam Updated Vital Signs BP 112/67   Pulse 92   Temp 98.3 F (36.8 C) (Oral)   Resp 20   Ht 5' 7.5" (1.715 m)   Wt 110.2 kg (243 lb)   LMP 09/28/2016   SpO2 99%   BMI 37.50 kg/m   Physical Exam  Constitutional: She is oriented to person, place, and time. She appears well-developed and well-nourished.  Non-toxic appearance.  HENT:  Head: Normocephalic.  Right Ear: Tympanic membrane and external ear normal.  Left Ear: Tympanic membrane and external ear normal.  Eyes: Pupils are equal, round, and reactive to light. EOM and lids are normal.  Neck: Normal range of motion. Neck supple. Carotid bruit is not present.  Cardiovascular:  Normal rate, regular rhythm, normal heart sounds, intact distal pulses and normal pulses.   Pulmonary/Chest: Breath sounds normal. No respiratory distress.  Abdominal: Soft. Bowel sounds are normal. There is no tenderness. There is no guarding.  Abdomen is gravid  Musculoskeletal: Normal range of motion.  Lymphadenopathy:       Head (right side): No submandibular adenopathy present.       Head (left side): No submandibular adenopathy present.    She has no cervical adenopathy.  Neurological: She is alert and oriented to person, place, and time. She has normal strength. No cranial nerve deficit or sensory deficit.  Skin: Skin is warm and dry.  Psychiatric: She has a normal mood and affect. Her speech  is normal.  Nursing note and vitals reviewed.    ED Treatments / Results  Labs (all labs ordered are listed, but only abnormal results are displayed) Labs Reviewed - No data to display  EKG  EKG Interpretation None       Radiology No results found.  Procedures Procedures (including critical care time)  Medications Ordered in ED Medications - No data to display   Initial Impression / Assessment and Plan / ED Course  I have reviewed the triage vital signs and the nursing notes.  Pertinent labs & imaging results that were available during my care of the patient were reviewed by me and considered in my medical decision making (see chart for details).       Final Clinical Impressions(s) / ED Diagnoses The patient states that the dog has had at least 2 treatments for rabies vaccine. The patient is pregnant at this time. The dog has not been tested for rabies, this will be done on August 31. The family has reached a decision to wait until the testing results return before engaging in rabies vaccinations. The patient would also like to discuss rabies vaccines with her OB physician as she is pregnant at the time.    Final diagnoses:  Encounter for consultation    New Prescriptions New Prescriptions   No medications on file     Ivery QualeBryant, Jaceyon Strole, Cordelia Poche-C 06/16/17 2146    Samuel JesterMcManus, Kathleen, DO 06/17/17 2059

## 2017-06-16 NOTE — Discharge Instructions (Signed)
Your vital signs within normal limits. Please see your OB physician or return to the emergency department if the rabies testing on your dog returns positive.

## 2017-06-16 NOTE — ED Triage Notes (Addendum)
Pt reports being licked x 1 wk ago by a dog that has been having "possible" rabies symptoms (frothy sputum, seizures, and hyperactivity). Dog has been vaccinated.

## 2017-06-17 ENCOUNTER — Telehealth (HOSPITAL_COMMUNITY): Payer: Self-pay | Admitting: *Deleted

## 2017-06-17 NOTE — Telephone Encounter (Signed)
Preadmission screen  

## 2017-06-27 ENCOUNTER — Encounter (HOSPITAL_COMMUNITY): Payer: Self-pay

## 2017-06-27 NOTE — H&P (Signed)
Julia Ruiz is a 31 y.o. female, G3P2002 at 3839 1/7 weeks, presenting for scheduled repeat cesarean delivery on 06/29/17.  She has a history of C/S x 2.  She denies leaking, bleeding, and reports +FM.  She declines BTL.  Problem List: Prior C/S x 2 BMI 37 Hx GDM in prior pregnancy Hx LGA fetus in first pregnancy  History of present pregnancy: Patient entered care at 23 weeks, in transfer [facility].  She had an abscess near her previous C/S scar around 9 weeks, was treated with Keflex, and had I&D at 10 weeks. EDC of 07/05/17 was established by LMP and in agreement with US at 7 1/7 weeks.   Anatomy scan: 20 weeks, with normal findings and an anterior placenta.   Additional US evaluations:  34 6/7 weeks--EFW 6+7, 67%ile, normal fluid  Significant prenatal events:    Early glucola WNL.  Occasional faintness, no clear etiology.  Planned repeat C/S, declined BTL.   Possible rabies exposure 06/16/17 from family dog by exposure to saliva and scratches--dog had received 2 rabies vaccines, elected to defer treatment for self until dog tested. Last evaluation:  06/28/17--BP 110/70, weight 243, US showed EFW 8+8, declines tubal.  OB History    Gravida Para Term Preterm AB Living   3 2 2  0 0 2   SAB TAB Ectopic Multiple Live Births   0 0 0   2    2006--Primary LTCS, 41 weeks, failure to descend, 11+9, [place]--Repeat LTCS, 38 weeks, repeat, 8+9, [place]  Past Medical History:  Diagnosis Date  . Abnormal cervical Papanicolaou smear    as teenager, normal since per her report  . Complication of anesthesia    spinal headache  . Gestational diabetes    Past Surgical History:  Procedure Laterality Date  . CESAREAN SECTION    . CESAREAN SECTION     Family History: family history includes Irritable bowel syndrome in her mother; Mental illness in her brother.   Social History:  reports that she has never smoked. She has never used smokeless  tobacco. She reports that she does not drink alcohol or use drugs.  Patient is multi-racial, high school educated, married to [place].  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   Prenatal Transfer Tool  Maternal Diabetes: No Genetic Screening: Normal Maternal Ultrasounds/Referrals: Normal Fetal Ultrasounds or other Referrals:  None Maternal Substance Abuse:  No Significant Maternal Medications:  None Significant Maternal Lab Results: Lab values include: Group B Strep negative  TDAP 04/19/17 Flu 08/18/16 ROS:  Occasional UCs, + FM.  Allergies  Allergen Reactions  . Tetracyclines & Related Hives, Shortness Of Breath and Other (See Comments)    wheezing  . Clindagel [Clindamycin] Itching       Last menstrual period 09/28/2016.  Chest clear Heart RRR without murmur Abd gravid, NT, FH 40 cm Pelvic: Deferred Ext: WNL  FHR: 139 at office visit yesterday UCs:  Occasional per patient.  Prenatal labs: ABO, Rh: --/--/O POS, O POS (09/11 1030) Antibody: NEG (09/11 1030) Rubella:  Rubella RPR: Nonreactive (03/13 0000)  HBsAg: Negative (03/13 0000)  HIV: Non-reactive (03/13 0000)  GBS:  Negative 06/06/17 Sickle cell/Hgb electrophoresis:  Unknown Pap:  WNL 08/2016 GC:  Neg 1/30, 12/20/16, 12/28/16 Chlamydia:  Neg 1/30, 12/20/16, 12/28/16 Genetic screenings:  Normal 1st trimester screen and AFP Glucola:  WNL Other:   Hgb 11 at NOB, 10.6 at 28 weeks     Assessment/Plan: IUP at  39 1/7 Prior C/S x 2, desires repeat Declines BTL Hx LGA fetus with first pregnancy (11+9) BMI 37.7 GBS negative Possible rabies exposure 06/16/17  Plan: Admit to [facility] per consult with Dr. Su Hilt for repeat cesarean delivery. Routine [facility] pre-op orders Will need Hgb electrophoresis drawn during hospital stay.  Nyra Capes, [place] 06/28/2017, 4:36 PM

## 2017-06-28 ENCOUNTER — Encounter (HOSPITAL_COMMUNITY)
Admission: RE | Admit: 2017-06-28 | Discharge: 2017-06-28 | Disposition: A | Discharge status: Home / Self Care | Payer: 59 | Source: Ambulatory Visit | Attending: Obstetrics and Gynecology | Admitting: Obstetrics and Gynecology

## 2017-06-28 DIAGNOSIS — Z6837 Body mass index (BMI) 37.0-37.9, adult: Secondary | ICD-10-CM | POA: Diagnosis not present

## 2017-06-28 DIAGNOSIS — O34211 Maternal care for low transverse scar from previous cesarean delivery: Secondary | ICD-10-CM | POA: Diagnosis not present

## 2017-06-28 DIAGNOSIS — O26849 Uterine size-date discrepancy, unspecified trimester: Secondary | ICD-10-CM | POA: Diagnosis not present

## 2017-06-28 DIAGNOSIS — Z3A39 39 weeks gestation of pregnancy: Secondary | ICD-10-CM | POA: Diagnosis not present

## 2017-06-28 DIAGNOSIS — Z3483 Encounter for supervision of other normal pregnancy, third trimester: Secondary | ICD-10-CM | POA: Diagnosis not present

## 2017-06-28 DIAGNOSIS — O99214 Obesity complicating childbirth: Secondary | ICD-10-CM | POA: Diagnosis not present

## 2017-06-28 HISTORY — DX: Adverse effect of unspecified anesthetic, initial encounter: T41.45XA

## 2017-06-28 HISTORY — DX: Other complications of anesthesia, initial encounter: T88.59XA

## 2017-06-28 HISTORY — DX: Gestational diabetes mellitus in pregnancy, unspecified control: O24.419

## 2017-06-28 LAB — TYPE AND SCREEN
ABO/RH(D): O POS
Antibody Screen: NEGATIVE

## 2017-06-28 LAB — CBC
HCT: 36.1 % (ref 36.0–46.0)
Hemoglobin: 11.9 g/dL — ABNORMAL LOW (ref 12.0–15.0)
MCH: 28.1 pg (ref 26.0–34.0)
MCHC: 33 g/dL (ref 30.0–36.0)
MCV: 85.1 fL (ref 78.0–100.0)
PLATELETS: 268 10*3/uL (ref 150–400)
RBC: 4.24 MIL/uL (ref 3.87–5.11)
RDW: 15.1 % (ref 11.5–15.5)
WBC: 6.5 10*3/uL (ref 4.0–10.5)

## 2017-06-28 NOTE — Patient Instructions (Signed)
20 Julia BarbaraCiara Ruiz  06/28/2017   Your procedure is scheduled on:  09/28/2017  Enter through the Main Entrance of [facility] at 0850 AM.  Pick up the phone at the desk and dial [phone].   Call this number if you have problems the morning of surgery: [phone]   Remember:   Do not eat food:After Midnight.  Do not drink clear liquids: After Midnight.  Take these medicines the morning of surgery with A SIP OF WATER: NONE   Do not wear jewelry, make-up or nail polish.  Do not wear lotions, powders, or perfumes. Do not wear deodorant.  Do not shave 48 hours prior to surgery.  Do not bring valuables to the hospital.  [facility] [employer] is not   responsible for any belongings or valuables brought to the hospital.  Contacts, dentures or bridgework may not be worn into surgery.  Leave suitcase in the car. After surgery it may be brought to your room.  For patients admitted to the hospital, checkout time is 11:00 AM the day of              discharge.   Patients discharged the day of surgery will not be allowed to drive             home.  Name and phone number of your driver: NA  Special Instructions:   N/A   Please read over the following fact sheets that you were given:   Surgical Site Infection Prevention

## 2017-06-29 ENCOUNTER — Inpatient Hospital Stay (HOSPITAL_COMMUNITY): Payer: 59 | Admitting: Anesthesiology

## 2017-06-29 ENCOUNTER — Encounter (HOSPITAL_COMMUNITY): Payer: Self-pay | Admitting: *Deleted

## 2017-06-29 ENCOUNTER — Encounter (HOSPITAL_COMMUNITY)
Admission: AD | Disposition: A | Discharge status: Home / Self Care | Payer: Self-pay | Source: Ambulatory Visit | Attending: Obstetrics and Gynecology

## 2017-06-29 ENCOUNTER — Inpatient Hospital Stay (HOSPITAL_COMMUNITY)
Admission: AD | Admit: 2017-06-29 | Discharge: 2017-07-02 | DRG: 766 | Disposition: A | Discharge status: Home / Self Care | Payer: 59 | Source: Ambulatory Visit | Attending: Obstetrics and Gynecology | Admitting: Obstetrics and Gynecology

## 2017-06-29 DIAGNOSIS — Z6837 Body mass index (BMI) 37.0-37.9, adult: Secondary | ICD-10-CM | POA: Diagnosis not present

## 2017-06-29 DIAGNOSIS — Z3A39 39 weeks gestation of pregnancy: Secondary | ICD-10-CM

## 2017-06-29 DIAGNOSIS — O99214 Obesity complicating childbirth: Secondary | ICD-10-CM | POA: Diagnosis present

## 2017-06-29 DIAGNOSIS — O34211 Maternal care for low transverse scar from previous cesarean delivery: Secondary | ICD-10-CM | POA: Diagnosis not present

## 2017-06-29 DIAGNOSIS — O34219 Maternal care for unspecified type scar from previous cesarean delivery: Secondary | ICD-10-CM | POA: Diagnosis not present

## 2017-06-29 DIAGNOSIS — Z98891 History of uterine scar from previous surgery: Secondary | ICD-10-CM

## 2017-06-29 LAB — ABO/RH: ABO/RH(D): O POS

## 2017-06-29 LAB — RPR: RPR Ser Ql: NONREACTIVE

## 2017-06-29 SURGERY — Surgical Case
Anesthesia: Epidural | Wound class: Clean Contaminated

## 2017-06-29 MED ORDER — SIMETHICONE 80 MG PO CHEW: 80.0000 mg | CHEWABLE_TABLET | ORAL | Status: DC | PRN

## 2017-06-29 MED ORDER — LACTATED RINGERS IV SOLN
INTRAVENOUS | Status: DC
  Administered 2017-06-29: 12:00:00 via INTRAVENOUS
  Administered 2017-06-29: 125 mL via INTRAVENOUS

## 2017-06-29 MED ORDER — SENNOSIDES-DOCUSATE SODIUM 8.6-50 MG PO TABS
2.0000 | ORAL_TABLET | ORAL | Status: DC
  Administered 2017-06-29 – 2017-07-02 (×3): 2 via ORAL
  Filled 2017-06-29 (×3): qty 2

## 2017-06-29 MED ORDER — NALBUPHINE HCL 10 MG/ML IJ SOLN: 5.0000 mg | INTRAMUSCULAR | Status: DC | PRN

## 2017-06-29 MED ORDER — MEPERIDINE HCL 25 MG/ML IJ SOLN: 6.2500 mg | INTRAMUSCULAR | Status: DC | PRN

## 2017-06-29 MED ORDER — FENTANYL CITRATE (PF) 100 MCG/2ML IJ SOLN
25.0000 ug | INTRAMUSCULAR | Status: DC | PRN
  Administered 2017-06-29: 25 ug via INTRAVENOUS

## 2017-06-29 MED ORDER — DIPHENHYDRAMINE HCL 50 MG/ML IJ SOLN: 12.5000 mg | INTRAMUSCULAR | Status: DC | PRN

## 2017-06-29 MED ORDER — LACTATED RINGERS IV SOLN
INTRAVENOUS | Status: DC | PRN
  Administered 2017-06-29: 12:00:00 via INTRAVENOUS

## 2017-06-29 MED ORDER — KETOROLAC TROMETHAMINE 30 MG/ML IJ SOLN: 30.0000 mg | Freq: Four times a day (QID) | INTRAMUSCULAR | Status: AC | PRN

## 2017-06-29 MED ORDER — NALOXONE HCL 2 MG/2ML IJ SOSY
1.0000 ug/kg/h | PREFILLED_SYRINGE | INTRAVENOUS | Status: DC | PRN
  Filled 2017-06-29: qty 2

## 2017-06-29 MED ORDER — OXYCODONE HCL 5 MG PO TABS
10.0000 mg | ORAL_TABLET | ORAL | Status: DC | PRN
  Administered 2017-07-01: 10 mg via ORAL
  Filled 2017-06-29: qty 2

## 2017-06-29 MED ORDER — SCOPOLAMINE 1 MG/3DAYS TD PT72
1.0000 | MEDICATED_PATCH | Freq: Once | TRANSDERMAL | Status: AC
  Administered 2017-06-29: 1.5 mg via TRANSDERMAL

## 2017-06-29 MED ORDER — OXYTOCIN 10 UNIT/ML IJ SOLN
INTRAMUSCULAR | Status: AC
  Filled 2017-06-29: qty 4

## 2017-06-29 MED ORDER — PHENYLEPHRINE 8 MG IN D5W 100 ML (0.08MG/ML) PREMIX OPTIME
INJECTION | INTRAVENOUS | Status: AC
  Filled 2017-06-29: qty 100

## 2017-06-29 MED ORDER — PHENYLEPHRINE HCL 10 MG/ML IJ SOLN
INTRAMUSCULAR | Status: DC | PRN
  Administered 2017-06-29: 80 ug via INTRAVENOUS
  Administered 2017-06-29: 40 ug via INTRAVENOUS
  Administered 2017-06-29: 80 ug via INTRAVENOUS

## 2017-06-29 MED ORDER — NALOXONE HCL 0.4 MG/ML IJ SOLN: 0.4000 mg | INTRAMUSCULAR | Status: DC | PRN

## 2017-06-29 MED ORDER — CEFAZOLIN SODIUM-DEXTROSE 2-4 GM/100ML-% IV SOLN
2.0000 g | INTRAVENOUS | Status: AC
  Administered 2017-06-29: 2 g via INTRAVENOUS

## 2017-06-29 MED ORDER — PHENYLEPHRINE 8 MG IN D5W 100 ML (0.08MG/ML) PREMIX OPTIME
INJECTION | INTRAVENOUS | Status: DC | PRN
  Administered 2017-06-29: 60 ug/min via INTRAVENOUS

## 2017-06-29 MED ORDER — LACTATED RINGERS IV SOLN
INTRAVENOUS | Status: DC
  Administered 2017-06-29: 22:00:00 via INTRAVENOUS

## 2017-06-29 MED ORDER — FENTANYL CITRATE (PF) 100 MCG/2ML IJ SOLN
INTRAMUSCULAR | Status: AC
  Filled 2017-06-29: qty 2

## 2017-06-29 MED ORDER — DIPHENHYDRAMINE HCL 25 MG PO CAPS
25.0000 mg | ORAL_CAPSULE | ORAL | Status: DC | PRN
  Administered 2017-06-29 (×2): 25 mg via ORAL
  Filled 2017-06-29: qty 1

## 2017-06-29 MED ORDER — ONDANSETRON HCL 4 MG/2ML IJ SOLN
INTRAMUSCULAR | Status: AC
  Filled 2017-06-29: qty 2

## 2017-06-29 MED ORDER — DIBUCAINE 1 % RE OINT: 1.0000 "application " | TOPICAL_OINTMENT | RECTAL | Status: DC | PRN

## 2017-06-29 MED ORDER — MORPHINE SULFATE (PF) 0.5 MG/ML IJ SOLN
INTRAMUSCULAR | Status: DC | PRN
  Administered 2017-06-29: 4 mg via EPIDURAL

## 2017-06-29 MED ORDER — PHENYLEPHRINE 40 MCG/ML (10ML) SYRINGE FOR IV PUSH (FOR BLOOD PRESSURE SUPPORT)
PREFILLED_SYRINGE | INTRAVENOUS | Status: AC
  Filled 2017-06-29: qty 10

## 2017-06-29 MED ORDER — LIDOCAINE-EPINEPHRINE 2 %-1:100000 IJ SOLN
INTRAMUSCULAR | Status: DC | PRN
  Administered 2017-06-29: 3 mL via INTRADERMAL
  Administered 2017-06-29: 5 mL via INTRADERMAL
  Administered 2017-06-29: 2 mL
  Administered 2017-06-29 (×2): 3 mL via INTRADERMAL

## 2017-06-29 MED ORDER — OXYCODONE HCL 5 MG PO TABS
5.0000 mg | ORAL_TABLET | ORAL | Status: DC | PRN
  Administered 2017-07-01 – 2017-07-02 (×3): 5 mg via ORAL
  Filled 2017-06-29 (×3): qty 1

## 2017-06-29 MED ORDER — SODIUM CHLORIDE 0.9% FLUSH: 3.0000 mL | INTRAVENOUS | Status: DC | PRN

## 2017-06-29 MED ORDER — TETANUS-DIPHTH-ACELL PERTUSSIS 5-2.5-18.5 LF-MCG/0.5 IM SUSP: 0.5000 mL | Freq: Once | INTRAMUSCULAR | Status: DC

## 2017-06-29 MED ORDER — SIMETHICONE 80 MG PO CHEW
80.0000 mg | CHEWABLE_TABLET | ORAL | Status: DC
Start: 2017-06-30 — End: 2017-07-02
  Administered 2017-06-29 – 2017-07-02 (×3): 80 mg via ORAL
  Filled 2017-06-29 (×3): qty 1

## 2017-06-29 MED ORDER — MORPHINE SULFATE (PF) 0.5 MG/ML IJ SOLN
INTRAMUSCULAR | Status: AC
  Filled 2017-06-29: qty 10

## 2017-06-29 MED ORDER — BUPIVACAINE IN DEXTROSE 0.75-8.25 % IT SOLN
INTRATHECAL | Status: AC
  Filled 2017-06-29: qty 2

## 2017-06-29 MED ORDER — COCONUT OIL OIL
1.0000 "application " | TOPICAL_OIL | Status: DC | PRN
  Administered 2017-07-01: 1 via TOPICAL
  Filled 2017-06-29: qty 120

## 2017-06-29 MED ORDER — ZOLPIDEM TARTRATE 5 MG PO TABS: 5.0000 mg | ORAL_TABLET | Freq: Every evening | ORAL | Status: DC | PRN

## 2017-06-29 MED ORDER — LIDOCAINE HCL (PF) 1 % IJ SOLN
INTRAMUSCULAR | Status: AC
Start: 2017-06-29 — End: 2017-06-29
  Filled 2017-06-29: qty 5

## 2017-06-29 MED ORDER — MEDROXYPROGESTERONE ACETATE 150 MG/ML IM SUSP: 150.0000 mg | INTRAMUSCULAR | Status: DC | PRN

## 2017-06-29 MED ORDER — PRENATAL MULTIVITAMIN CH
1.0000 | ORAL_TABLET | Freq: Every day | ORAL | Status: DC
  Administered 2017-06-30 – 2017-07-02 (×3): 1 via ORAL
  Filled 2017-06-29 (×3): qty 1

## 2017-06-29 MED ORDER — OXYTOCIN 40 UNITS IN LACTATED RINGERS INFUSION - SIMPLE MED
INTRAVENOUS | Status: DC | PRN
  Administered 2017-06-29: 40 mL via INTRAVENOUS

## 2017-06-29 MED ORDER — MENTHOL 3 MG MT LOZG: 1.0000 | LOZENGE | OROMUCOSAL | Status: DC | PRN

## 2017-06-29 MED ORDER — WITCH HAZEL-GLYCERIN EX PADS: 1.0000 "application " | MEDICATED_PAD | CUTANEOUS | Status: DC | PRN

## 2017-06-29 MED ORDER — DEXAMETHASONE SODIUM PHOSPHATE 4 MG/ML IJ SOLN
INTRAMUSCULAR | Status: AC
  Filled 2017-06-29: qty 1

## 2017-06-29 MED ORDER — ONDANSETRON HCL 4 MG/2ML IJ SOLN
INTRAMUSCULAR | Status: DC | PRN
  Administered 2017-06-29: 4 mg via INTRAVENOUS

## 2017-06-29 MED ORDER — EPHEDRINE 5 MG/ML INJ
INTRAVENOUS | Status: AC
  Filled 2017-06-29: qty 10

## 2017-06-29 MED ORDER — IBUPROFEN 600 MG PO TABS
600.0000 mg | ORAL_TABLET | Freq: Four times a day (QID) | ORAL | Status: DC
Start: 2017-06-29 — End: 2017-07-02
  Administered 2017-06-29 – 2017-07-02 (×11): 600 mg via ORAL
  Filled 2017-06-29 (×12): qty 1

## 2017-06-29 MED ORDER — ACETAMINOPHEN 325 MG PO TABS
650.0000 mg | ORAL_TABLET | ORAL | Status: DC | PRN
  Administered 2017-06-30 – 2017-07-02 (×3): 650 mg via ORAL
  Filled 2017-06-29 (×3): qty 2

## 2017-06-29 MED ORDER — FENTANYL CITRATE (PF) 100 MCG/2ML IJ SOLN
INTRAMUSCULAR | Status: DC | PRN
  Administered 2017-06-29: 90 ug via INTRAVENOUS

## 2017-06-29 MED ORDER — OXYTOCIN 40 UNITS IN LACTATED RINGERS INFUSION - SIMPLE MED: 2.5000 [IU]/h | INTRAVENOUS | Status: AC

## 2017-06-29 MED ORDER — NALBUPHINE HCL 10 MG/ML IJ SOLN: 5.0000 mg | Freq: Once | INTRAMUSCULAR | Status: DC | PRN

## 2017-06-29 MED ORDER — ONDANSETRON HCL 4 MG/2ML IJ SOLN: 4.0000 mg | Freq: Three times a day (TID) | INTRAMUSCULAR | Status: DC | PRN

## 2017-06-29 MED ORDER — SCOPOLAMINE 1 MG/3DAYS TD PT72
MEDICATED_PATCH | TRANSDERMAL | Status: AC
  Filled 2017-06-29: qty 1

## 2017-06-29 MED ORDER — SIMETHICONE 80 MG PO CHEW
80.0000 mg | CHEWABLE_TABLET | Freq: Three times a day (TID) | ORAL | Status: DC
  Administered 2017-06-30 – 2017-07-02 (×6): 80 mg via ORAL
  Filled 2017-06-29 (×7): qty 1

## 2017-06-29 MED ORDER — DEXAMETHASONE SODIUM PHOSPHATE 4 MG/ML IJ SOLN
INTRAMUSCULAR | Status: DC | PRN
  Administered 2017-06-29: 10 mg via INTRAVENOUS

## 2017-06-29 MED ORDER — SCOPOLAMINE 1 MG/3DAYS TD PT72
MEDICATED_PATCH | TRANSDERMAL | Status: AC
  Administered 2017-06-29: 1.5 mg via TRANSDERMAL
  Filled 2017-06-29: qty 1

## 2017-06-29 MED ORDER — DIPHENHYDRAMINE HCL 25 MG PO CAPS
25.0000 mg | ORAL_CAPSULE | Freq: Four times a day (QID) | ORAL | Status: DC | PRN
  Filled 2017-06-29: qty 1

## 2017-06-29 SURGICAL SUPPLY — 38 items
BENZOIN TINCTURE PRP APPL 2/3 (GAUZE/BANDAGES/DRESSINGS) ×3 IMPLANT
CELLS DAT CNTRL 66122 CELL SVR (MISCELLANEOUS) ×1 IMPLANT
CHLORAPREP W/TINT 26ML (MISCELLANEOUS) ×3 IMPLANT
CLAMP CORD UMBIL (MISCELLANEOUS) IMPLANT
CLOSURE WOUND 1/2 X4 (GAUZE/BANDAGES/DRESSINGS) ×1
CLOTH BEACON ORANGE TIMEOUT ST (SAFETY) ×3 IMPLANT
CONTAINER PREFILL 10% NBF 15ML (MISCELLANEOUS) IMPLANT
DRSG OPSITE POSTOP 4X10 (GAUZE/BANDAGES/DRESSINGS) ×3 IMPLANT
ELECT REM PT RETURN 9FT ADLT (ELECTROSURGICAL) ×3
ELECTRODE REM PT RTRN 9FT ADLT (ELECTROSURGICAL) ×1 IMPLANT
EXTRACTOR VACUUM M CUP 4 TUBE (SUCTIONS) IMPLANT
EXTRACTOR VACUUM M CUP 4' TUBE (SUCTIONS)
GLOVE BIO SURGEON STRL SZ7.5 (GLOVE) ×3 IMPLANT
GLOVE BIOGEL PI IND STRL 7.0 (GLOVE) ×1 IMPLANT
GLOVE BIOGEL PI IND STRL 7.5 (GLOVE) ×1 IMPLANT
GLOVE BIOGEL PI INDICATOR 7.0 (GLOVE) ×2
GLOVE BIOGEL PI INDICATOR 7.5 (GLOVE) ×2
GOWN STRL REUS W/TWL LRG LVL3 (GOWN DISPOSABLE) ×6 IMPLANT
KIT ABG SYR 3ML LUER SLIP (SYRINGE) IMPLANT
NEEDLE HYPO 25X5/8 SAFETYGLIDE (NEEDLE) IMPLANT
NS IRRIG 1000ML POUR BTL (IV SOLUTION) ×3 IMPLANT
PACK C SECTION WH (CUSTOM PROCEDURE TRAY) ×3 IMPLANT
PAD OB MATERNITY 4.3X12.25 (PERSONAL CARE ITEMS) ×3 IMPLANT
PENCIL SMOKE EVAC W/HOLSTER (ELECTROSURGICAL) ×3 IMPLANT
RTRCTR C-SECT PINK 25CM LRG (MISCELLANEOUS) ×3 IMPLANT
RTRCTR WOUND ALEXIS 18CM MED (MISCELLANEOUS) ×3
SPONGE LAP 18X18 X RAY DECT (DISPOSABLE) ×3 IMPLANT
STRIP CLOSURE SKIN 1/2X4 (GAUZE/BANDAGES/DRESSINGS) ×2 IMPLANT
SUT CHROMIC 2 0 CT 1 (SUTURE) ×3 IMPLANT
SUT MNCRL AB 3-0 PS2 27 (SUTURE) ×3 IMPLANT
SUT PLAIN 2 0 XLH (SUTURE) ×3 IMPLANT
SUT VIC AB 0 CT1 36 (SUTURE) ×3 IMPLANT
SUT VIC AB 0 CTX 36 (SUTURE) ×6
SUT VIC AB 0 CTX36XBRD ANBCTRL (SUTURE) ×3 IMPLANT
SUT VIC AB 2-0 SH 27 (SUTURE) ×4
SUT VIC AB 2-0 SH 27XBRD (SUTURE) ×2 IMPLANT
TOWEL OR 17X24 6PK STRL BLUE (TOWEL DISPOSABLE) ×3 IMPLANT
TRAY FOLEY BAG SILVER LF 14FR (SET/KITS/TRAYS/PACK) ×3 IMPLANT

## 2017-06-29 NOTE — Plan of Care (Signed)
Problem: Pain Managment: Goal: General experience of comfort will improve Outcome: Progressing Patient has not required additional pain meds.  Problem: Education: Goal: Knowledge of condition will improve Outcome: Progressing Taught incentive spriometer use, but needs reinforcement to use it.  Problem: Urinary Elimination: Goal: Ability to reestablish a normal urinary elimination pattern will improve Outcome: Progressing Pt has foley cath with adequate output

## 2017-06-29 NOTE — Anesthesia Preprocedure Evaluation (Addendum)
Anesthesia Evaluation  Patient identified by MRN, date of birth, ID band Patient awake    Reviewed: Allergy & Precautions, NPO status , Patient's Chart, lab work & pertinent test results  History of Anesthesia Complications (+) POST - OP SPINAL HEADACHE  Airway Mallampati: II  TM Distance: >3 FB Neck ROM: Full    Dental   Pulmonary neg pulmonary ROS,    Pulmonary exam normal        Cardiovascular negative cardio ROS Normal cardiovascular exam     Neuro/Psych negative neurological ROS     GI/Hepatic negative GI ROS, Neg liver ROS,   Endo/Other  diabetes, GestationalMorbid obesity  Renal/GU negative Renal ROS     Musculoskeletal   Abdominal   Peds  Hematology negative hematology ROS (+)   Anesthesia Other Findings   Reproductive/Obstetrics (+) Pregnancy                             Lab Results  Component Value Date   WBC 6.5 06/28/2017   HGB 11.9 (L) 06/28/2017   HCT 36.1 06/28/2017   MCV 85.1 06/28/2017   PLT 268 06/28/2017   Lab Results  Component Value Date   CREATININE 0.53 07/22/2010   BUN 8 07/22/2010   NA 137 07/22/2010   K 3.8 07/22/2010   CL 106 07/22/2010   CO2 26 07/22/2010    Anesthesia Physical Anesthesia Plan  ASA: II  Anesthesia Plan: Epidural   Post-op Pain Management:    Induction:   PONV Risk Score and Plan: 3 and Ondansetron, Scopolamine patch - Pre-op, Metaclopromide and Treatment may vary due to age or medical condition  Airway Management Planned: Natural Airway  Additional Equipment:   Intra-op Plan:   Post-operative Plan:   Informed Consent: I have reviewed the patients History and Physical, chart, labs and discussed the procedure including the risks, benefits and alternatives for the proposed anesthesia with the patient or authorized representative who has indicated his/her understanding and acceptance.     Plan Discussed with:  CRNA  Anesthesia Plan Comments:        Anesthesia Quick Evaluation

## 2017-06-29 NOTE — Op Note (Signed)
Cesarean Section Procedure Note  Indications: P2 at 39 1/7wks scheduled for repeat c-section.  Pre-operative Diagnosis: 1.39 1/7wks 2.Prior Cesarean Section   Post-operative Diagnosis: 1.39 1/7wks 2.Prior Cesarean Section  Procedure: REPEAT CESAREAN SECTION  Surgeon: Osborn Cohooberts, Bennetta Rudden, MD    Assistants: Genice Rougeracey Tucker, RNFA  Anesthesia: Spinal  Anesthesiologist: Marcene DuosFitzgerald, Robert, MD   Procedure Details  The patient was taken to the operating room secondary to scheduled repeat c-section after the risks, benefits, complications, treatment options, and expected outcomes were discussed with the patient.  The patient concurred with the proposed plan, giving informed consent which was signed and witnessed. The patient was taken to Operating Room C-Section Suite, identified as Julia Ruiz and the procedure verified as C-Section Delivery. A Time Out was held and the above information confirmed.  After induction of anesthesia by obtaining a spinal, the patient was prepped and draped in the usual sterile manner. A Pfannenstiel skin incision was made and carried down through the subcutaneous tissue to the underlying layer of fascia.  The fascia was incised bilaterally and extended transversely bilaterally with the Mayo scissors. Kocher clamps were placed on the inferior aspect of the fascial incision and the underlying rectus muscle was separated from the fascia. The same was done on the superior aspect of the fascial incision.  The peritoneum was identified, entered bluntly and extended manually.  An Alexis self-retaining retractor was placed.  The utero-vesical peritoneal reflection was incised transversely and the bladder flap was bluntly freed from the lower uterine segment. A low transverse uterine incision was made with the scalpel and extended bilaterally with the bandage scissors.  The infant was delivered in vertex position without difficulty.  After the umbilical cord was clamped and cut, the  infant was handed to the awaiting pediatricians.  Cord blood was obtained for evaluation.  The placenta was removed intact and appeared to be within normal limits. The uterus was cleared of all clots and debris. The uterine incision was closed with running interlocking sutures of 0 Vicryl and a second imbricating layer was performed as well.   Bilateral tubes and ovaries appeared to be within normal limits.  Good hemostasis was noted.  Copious irrigation was performed until clear.  The peritoneum was repaired with 2-0 chromic via a running suture.  The fascia was reapproximated with a running suture of 0 Vicryl. The subcutaneous tissue was reapproximated with 3 interrupted sutures of 2-0 plain.  The skin was reapproximated with a subcuticular suture of 3-0 monocryl.  Steristrips were applied.  Instrument, sponge, and needle counts were correct prior to abdominal closure and at the conclusion of the case.  The patient was awaiting transfer to the recovery room in good condition.  Findings: Live female infant with Apgars 8 at one minute and 9 at five minutes.  Normal appearing bilateral ovaries and fallopian tubes were noted.  Estimated Blood Loss:  793ml         Drains: foley to gravity 300 cc         Total IV Fluids: 2600 ml         Specimens to Pathology: Placenta         Complications:  None; patient tolerated the procedure well.         Disposition: PACU - hemodynamically stable.         Condition: stable  Attending Attestation: I performed the procedure.

## 2017-06-29 NOTE — Anesthesia Procedure Notes (Addendum)
Epidural Patient location during procedure: OR Start time: 06/29/2017 11:15 AM End time: 06/29/2017 11:33 AM  Staffing Anesthesiologist: Marcene DuosFITZGERALD, Malli Falotico Performed: anesthesiologist   Preanesthetic Checklist Completed: patient identified, site marked, surgical consent, pre-op evaluation, timeout performed, IV checked, risks and benefits discussed and monitors and equipment checked  Epidural Patient position: sitting Prep: site prepped and draped and DuraPrep Patient monitoring: continuous pulse ox and blood pressure Approach: midline Location: L4-L5 Injection technique: LOR air  Needle:  Needle type: Tuohy  Needle gauge: 17 G Needle length: 15 cm and 15 Needle insertion depth: 12 cm Catheter type: closed end flexible Catheter size: 19 Gauge Catheter at skin depth: 17 cm Test dose: negative  Assessment Sensory level: T6 Events: blood not aspirated, injection not painful, no injection resistance, negative IV test and no paresthesia  Additional Notes Unable to do spinal or CSE due to depth to epidural space. LOR at 12cm.

## 2017-06-29 NOTE — Interval H&P Note (Signed)
History and Physical Interval Note:  06/29/2017 10:32 AM  Julia Ruiz  has presented today for surgery, with the diagnosis of Prior Cesarean Section  The various methods of treatment have been discussed with the patient and family. After consideration of risks, benefits and other options for treatment, the patient has consented to  Procedure(s) with comments: REPEAT CESAREAN SECTION (N/A) - Tracey RNFA as a surgical intervention .  The patient's history has been reviewed, patient examined, no change in status, stable for surgery.  I have reviewed the patient's chart and labs.  Questions were answered to the patient's satisfaction.     Purcell NailsOBERTS,Stehanie Ekstrom Y

## 2017-06-29 NOTE — Transfer of Care (Signed)
Immediate Anesthesia Transfer of Care Note  Patient: Julia Ruiz  Procedure(s) Performed: Procedure(s) with comments: REPEAT CESAREAN SECTION (N/A) - Tracey RNFA  Patient Location: PACU  Anesthesia Type:Epidural  Level of Consciousness: awake, alert  and oriented  Airway & Oxygen Therapy: Patient Spontanous Breathing  Post-op Assessment: Report given to RN and Post -op Vital signs reviewed and stable  Post vital signs: Reviewed and stable  Last Vitals:  Vitals:   06/29/17 1320 06/29/17 1330  BP: 116/72 112/74  Pulse: 90 80  Resp: 15 13  Temp: 36.6 C   SpO2: 100% 99%    Last Pain:  Vitals:   06/29/17 1320  TempSrc: Oral  PainSc: 4          Complications: No apparent anesthesia complications

## 2017-06-29 NOTE — Transfer of Care (Signed)
Immediate Anesthesia Transfer of Care Note  Patient: Julia Ruiz  Procedure(s) Performed: Procedure(s) with comments: REPEAT CESAREAN SECTION (N/A) - Tracey RNFA  Patient Location: PACU  Anesthesia Type:Epidural  Level of Consciousness: awake and alert   Airway & Oxygen Therapy: Patient Spontanous Breathing  Post-op Assessment: Report given to RN  Post vital signs: Reviewed  Last Vitals:  Vitals:   06/29/17 1320 06/29/17 1330  BP: 116/72 112/74  Pulse: 90 80  Resp: 15 13  Temp: 36.6 C   SpO2: 100% 99%    Last Pain:  Vitals:   06/29/17 1320  TempSrc: Oral  PainSc: 4          Complications: No apparent anesthesia complications

## 2017-06-29 NOTE — Anesthesia Postprocedure Evaluation (Signed)
Anesthesia Post Note  Patient: [name]  Procedure(s) Performed: Procedure(s) (LRB): REPEAT CESAREAN SECTION (N/A)     Patient location during evaluation: PACU Anesthesia Type: Epidural Level of consciousness: awake and alert Pain management: pain level controlled Vital Signs Assessment: post-procedure vital signs reviewed and stable Respiratory status: spontaneous breathing and respiratory function stable Cardiovascular status: blood pressure returned to baseline and stable Postop Assessment: epidural receding Anesthetic complications: no    Last Vitals:  Vitals:   06/29/17 1415 06/29/17 1433  BP: 123/77 (!) 110/56  Pulse: 78 90  Resp: 17   Temp: 37.2 C 37.1 C  SpO2: 98% 97%    Last Pain:  Vitals:   06/29/17 1440  TempSrc:   PainSc: 0-No pain   Pain Goal:                 Kennieth RadFitzgerald, Kunal Levario E

## 2017-06-30 ENCOUNTER — Encounter (HOSPITAL_COMMUNITY): Payer: Self-pay | Admitting: Obstetrics and Gynecology

## 2017-06-30 LAB — CBC
HEMATOCRIT: 30.8 % — AB (ref 36.0–46.0)
HEMOGLOBIN: 10.1 g/dL — AB (ref 12.0–15.0)
MCH: 28.1 pg (ref 26.0–34.0)
MCHC: 32.8 g/dL (ref 30.0–36.0)
MCV: 85.6 fL (ref 78.0–100.0)
Platelets: 219 10*3/uL (ref 150–400)
RBC: 3.6 MIL/uL — AB (ref 3.87–5.11)
RDW: 15.1 % (ref 11.5–15.5)
WBC: 9.7 10*3/uL (ref 4.0–10.5)

## 2017-06-30 LAB — HEMOGLOBINOPATHY EVALUATION
HGB A: 98.1 % (ref 96.4–98.8)
HGB C: 0 %
HGB F QUANT: 0 % (ref 0.0–2.0)
HGB VARIANT: 0 %
Hgb A2 Quant: 1.9 % (ref 1.8–3.2)
Hgb S Quant: 0 %

## 2017-06-30 NOTE — Lactation Note (Signed)
This note was copied from a baby's chart. Lactation Consultation Note  Patient Name: Girl Rivka BarbaraCiara Schaben ZOXWR'UToday's Date: 06/30/2017 Reason for consult: Follow-up assessment;Difficult latch  Baby 27 hours old. Mom called out for assistance with latching and hand expressing. Mom concerned that she is not seeing any flow at right breast. Assisted mom with hand expression, and mom's right breast is easily expressible. Mom happy to be able to express colostrum for herself. Assisted mom with positioning, and mom able to latch baby in football position to right breast. Mom reports increased comfort with this position, and some swallows noted. Mom given spoons and enc to hand express and spoon-feed after baby nurses--if she desires.   Maternal Data    Feeding Feeding Type: Breast Fed Length of feed: 15 min  LATCH Score Latch: Grasps breast easily, tongue down, lips flanged, rhythmical sucking.  Audible Swallowing: Spontaneous and intermittent  Type of Nipple: Everted at rest and after stimulation  Comfort (Breast/Nipple): Soft / non-tender  Hold (Positioning): Assistance needed to correctly position infant at breast and maintain latch.  LATCH Score: 9  Interventions Interventions: Assisted with latch;Skin to skin;Hand express;Breast compression;Adjust position;Support pillows;Position options  Lactation Tools Discussed/Used     Consult Status Consult Status: Follow-up Date: 07/01/17 Follow-up type: In-patient    Sherlyn HayJennifer D Milton Streicher 06/30/2017, 3:21 PM

## 2017-06-30 NOTE — Progress Notes (Signed)
Rivka BarbaraCiara Cianci [id number] Postpartum Day 1 S/P Repeat Cesarean Section due to patient history and desire.  Subjective: Patient up ad lib, denies syncope or dizziness. Reports consuming regular diet without issues and denies N/V. Patient reports no bowel movement but is passing flatus.  Foley catheter recently removed and no urination since.   Patient reports bleeding is "slowing."  Patient is breastfeeding and reports going well.  Desires postpartum contraception, but is unsure.  Pain is being appropriately managed with use of motrin. Patient having some itching, but notes improvement with benadryl.  Objective: Temp:  [97.8 F (36.6 C)-99 F (37.2 C)] 98.6 F (37 C) (09/13 0320) Pulse Rate:  [71-99] 84 (09/13 0320) Resp:  [13-20] 16 (09/13 0320) BP: (108-124)/(56-77) 108/60 (09/13 0320) SpO2:  [97 %-100 %] 98 % (09/12 1813) Weight:  [110.2 kg (243 lb)] 110.2 kg (243 lb) (09/12 0924)   Recent Labs  06/28/17 1030 06/30/17 0549  HGB 11.9* 10.1*  HCT 36.1 30.8*  WBC 6.5 9.7    Physical Exam:  General: alert, cooperative and no distress Mood/Affect: Appropriate/Bright Lungs: clear to auscultation, no wheezes, rales or rhonchi, symmetric air entry.  Heart: normal rate and regular rhythm. Breast: Soft, NT, Filling, No cracking. Abdomen:  + bowel sounds, soft, Appropriately tender Incision: CDI, Honeycomb dressing  Uterine Fundus: firm at U/-1 Lochia: appropriate Skin: Warm, Dry. DVT Evaluation: No evidence of DVT seen on physical exam. No significant calf/ankle edema. SCD On JP drain:   None  Assessment Post Operative Day 1 S/P Repeat C/S Normal Involution BreastFeeding Hemodynamically Stable  Plan: -Encouraged to ambulate -Okay for shower as appropriate -Continue motrin for pain mgmt as necessary -Lactation consult as appropriate -Plan for discharge tomorrow -Continue other mgmt as ordered -Dr. Alinda SierrasAR to be updated on patient status  Cherre RobinsJessica L Delmus Warwick MSN, CNM 06/30/2017,  6:20 AM

## 2017-06-30 NOTE — Lactation Note (Signed)
This note was copied from a baby's chart. Lactation Consultation Note Experienced BF mom Bf her 1st child now 31 yrs old for 2 yrs. BF her 2nd child now 89 yrs old for 6 months.   Mom has "V" shaped breast w/everted nipples. Hand expression w/colostrum noted.  When entered rm. Mom had baby STS laying across breast.  Mom stated baby had BF well.  Discussed BF positions, STS, I&O, cluster feeding, supply and demand. Mom had DEBP set up, has pumped once.Mom encouraged to feed baby 8-12 times/24 hours and with feeding cues.  Mom discussed pumping, and feeding the baby. Encouraged to call for assistance if needed. Mom had pacifier in crib. Discouraged for 2 weeks.  [facility] brochure given w/resources, support groups and LC services.   Patient Name: Girl Rivka BarbaraCiara Mcclaran ZOXWR'UToday's Date: 06/30/2017 Reason for consult: Initial assessment   Maternal Data Has patient been taught Hand Expression?: Yes Does the patient have breastfeeding experience prior to this delivery?: Yes  Feeding Feeding Type: Breast Fed Length of feed: 10 min  LATCH Score       Type of Nipple: Everted at rest and after stimulation  Comfort (Breast/Nipple): Soft / non-tender        Interventions Interventions: Breast feeding basics reviewed;Support pillows;Position options;Skin to skin;Breast massage;Hand express  Lactation Tools Discussed/Used Tools: Pump Breast pump type: Double-Electric Breast Pump Pump Review: Setup, frequency, and cleaning;Milk Storage Initiated by:: RN Date initiated:: 06/29/17   Consult Status Consult Status: Follow-up Date: 07/01/17 Follow-up type: In-patient    Charyl DancerCARVER, Oluwaseun Cremer G 06/30/2017, 6:36 AM

## 2017-07-01 MED ORDER — IBUPROFEN 600 MG PO TABS: 600.0000 mg | ORAL_TABLET | Freq: Four times a day (QID) | ORAL | 2 refills | Status: DC | PRN

## 2017-07-01 NOTE — Discharge Summary (Signed)
OB Discharge Summary     Patient Name: Julia Ruiz DOB: [birth year] MRN: [id number]  Date of admission: 06/29/2017 Delivering MD: Osborn Coho   Date of discharge: 07/01/2017  Admitting diagnosis: CTX Prior Cesarean Section Intrauterine pregnancy: [redacted]w[redacted]d     Secondary diagnosis:  Active Problems:   Status post repeat low transverse cesarean section  Additional problems: None     Discharge diagnosis: Term Pregnancy Delivered and S/P Repeat  C/S                                                                                                Post partum procedures:None  Augmentation: None  Complications: None  Hospital course:  Sceduled C/S   31 y.o. yo G3P3003 at [redacted]w[redacted]d was admitted to the hospital 06/29/2017 for scheduled cesarean section with the following indication:Elective Repeat.  Membrane Rupture Time/Date: 12:10 PM ,06/29/2017   Patient delivered a Viable infant.06/29/2017  Details of operation can be found in separate operative note.  Pateint had an uncomplicated postpartum course.  She is ambulating, tolerating a regular diet, passing flatus, and urinating well. Patient is discharged home in stable condition on  07/01/17         Physical exam  Vitals:   06/30/17 0745 06/30/17 1100 06/30/17 1741 07/01/17 0500  BP: 114/62 (!) 118/94 111/77 124/74  Pulse: 72 89 81 70  Resp: Temp: 97.9 F (36.6 C) 98.4 F (36.9 C) 98.2 F (36.8 C) 98.2 F (36.8 C)  TempSrc: Oral Oral Oral Oral  SpO2: 98% 98%    Weight:      Height:       General: alert, cooperative and no distress Lochia: appropriate Uterine Fundus: firm Incision: Healing well with no significant drainage, Honey comb dressing replaced on 9/14 DVT Evaluation: No significant calf/ankle edema. Labs: Lab Results  Component Value Date   WBC 9.7 06/30/2017   HGB 10.1 (L) 06/30/2017   HCT 30.8 (L) 06/30/2017   MCV 85.6 06/30/2017   PLT 219 06/30/2017   CMP Latest Ref Rng & Units 07/22/2010  Glucose  70 - 99 mg/dL 95  BUN 6 - 23 mg/dL 8  Creatinine 0.4 - 1.2 mg/dL 4.09  Sodium 811 - 914 mEq/L 137  Potassium 3.5 - 5.1 mEq/L 3.8  Chloride 96 - 112 mEq/L 106  CO2 19 - 32 mEq/L 26  Calcium 8.4 - 10.5 mg/dL 8.9  Total Protein 6.0 - 8.3 g/dL 6.1  Total Bilirubin 0.3 - 1.2 mg/dL 0.7  Alkaline Phos 39 - 117 U/L 40  AST 0 - 37 U/L 15  ALT 0 - 35 U/L 16    Discharge instruction: per After Visit Summary and "Baby and Me Booklet". Pain Management, Peri-Care, Breastfeeding, Who and When to call for postpartum complications. Information Sheet(s) given Iron Rich Diet,  IUD Remove steri-strips in 7-10 days, if applicable.  Incision care guidelines: how to clean, when to call, and anticipated healing.   After visit meds:  Allergies as of 07/01/2017      Reactions   Tetracyclines & Related Hives, Shortness Of Breath, Other (See  Comments)   wheezing   Clindagel [clindamycin] Itching      Medication List    STOP taking these medications   cetirizine 10 MG tablet Commonly known as:  ZYRTEC   NASACORT ALLERGY 24HR CHILDREN 55 MCG/ACT Aero nasal inhaler Generic drug:  triamcinolone     TAKE these medications   ibuprofen 600 MG tablet Commonly known as:  ADVIL,MOTRIN Take 1 tablet (600 mg total) by mouth every 6 (six) hours as needed.   multivitamin with minerals Tabs tablet Take 1 tablet by mouth daily.            Discharge Care Instructions        Start     Ordered   07/01/17 0000  ibuprofen (ADVIL,MOTRIN) 600 MG tablet  Every 6 hours PRN     07/01/17 0631   07/01/17 0000  Diet - low sodium heart healthy     07/01/17 0631   07/01/17 0000  Discharge instructions    Comments:  [facility] Handbook Activity Restrictions Who and when to call Follow Up Appointment   07/01/17 0631   07/01/17 0000  Driving restriction     Comments:  Avoid driving if taking narcotics and until you are able to move without significant pain.   07/01/17 0631   07/01/17 0000  Sexual acrtivity     Comments:  No sexual intercourse for 6 weeks No tampons, douche, or objects in vagina for 6 weeks   07/01/17 0631   07/01/17 0000  Lifting restrictions    Comments:  Weight restriction of 10 lbs.   07/01/17 0631   07/01/17 0000  Call MD for:  temperature >100.4     07/01/17 0631   07/01/17 0000  Call MD for:  redness, tenderness, or signs of infection (pain, swelling, redness, odor or green/yellow discharge around incision site)     07/01/17 0631   07/01/17 0000  Call MD for:  severe uncontrolled pain     07/01/17 0631   07/01/17 0000  Call MD for:  difficulty breathing, headache or visual disturbances     07/01/17 0631   07/01/17 0000  Call MD for:  persistant dizziness or light-headedness     07/01/17 0631      Diet: routine diet  Activity: Advance as tolerated. Pelvic rest for 6 weeks.   Outpatient follow up:6 weeks Follow up Appt:No future appointments. Follow up Visit:No Follow-up on file.  Postpartum contraception: IUD Paragard  Newborn Data: Live born female  Birth Weight: 8 lb 9.7 oz (3905 g) APGAR: 8, 9  Baby Feeding: Breast Disposition:home with mother   07/01/2017 Cherre Robins, CNM

## 2017-07-01 NOTE — Progress Notes (Signed)
Julia Ruiz [id number] Postpartum Day 2 S/P Repeat Cesarean Section due to patient history and desire.  Subjective: Patient up ad lib, denies syncope or dizziness. Reports consuming regular diet without issues and denies N/V. Patient reports no bowel movement but is passing flatus. Patient urinating without issues and continues to report light bleeding.  Patient is breastfeeding and reports going well.  Desires postpartum contraception, but is unsure.  Pain is being managed with use of motrin, but patient reports some breakthrough pain. Patient declines discharge option today.   Objective: Temp:  [98.2 F (36.8 C)-98.4 F (36.9 C)] 98.2 F (36.8 C) (09/14 0500) Pulse Rate:  [70-89] 70 (09/14 0500) Resp:  [17-18] 18 (09/14 0500) BP: (111-124)/(74-94) 124/74 (09/14 0500) SpO2:  [98 %] 98 % (09/13 1100)   Recent Labs  06/28/17 1030 06/30/17 0549  HGB 11.9* 10.1*  HCT 36.1 30.8*  WBC 6.5 9.7    Physical Exam:  General: alert, cooperative and no distress Mood/Affect: Appropriate/Bright Lungs: clear to auscultation, no wheezes, rales or rhonchi, symmetric air entry.  Heart: normal rate and regular rhythm. Breast: Soft, NT, Filling, No cracking. Abdomen:  + bowel sounds, soft, Appropriately tender Incision: Healing well. Honeycomb dressing replaced Uterine Fundus: firm at U/-1 Lochia: appropriate Skin: Warm, Dry. DVT Evaluation: No evidence of DVT seen on physical exam. No significant calf/ankle edema. SCD On JP drain:   None  Assessment Post Operative Day 2 S/P Repeat C/S Normal Involution BreastFeeding Hemodynamically Stable Pain  Plan: -Encouraged to ambulate -Start taking oxycodone  and titrate as appropriate for pain mgmt -Plan for discharge tomorrow -Continue other mgmt as ordered -Dr. Alinda Sierras updated on patient status  Cherre Robins MSN, CNM 07/01/2017, 9:53 AM

## 2017-07-02 MED ORDER — OXYCODONE HCL 5 MG PO TABS: 5.0000 mg | ORAL_TABLET | ORAL | 0 refills | Status: DC | PRN

## 2017-07-02 NOTE — Discharge Instructions (Signed)
Home Care Instructions for Mom °ACTIVITY °· Gradually return to your regular activities. °· Let yourself rest. Nap while your baby sleeps. °· Avoid lifting anything that is heavier than 10 lb (4.5 kg) until your health care provider says it is okay. °· Avoid activities that take a lot of effort and energy (are strenuous) until approved by your health care provider. Walking at a slow-to-moderate pace is usually safe. °· If you had a cesarean delivery: °? Do not vacuum, climb stairs, or drive a car for 4-6 weeks. °? Have someone help you at home until you feel like you can do your usual activities yourself. °? Do exercises as told by your health care provider, if this applies. ° °VAGINAL BLEEDING °You may continue to bleed for 4-6 weeks after delivery. Over time, the amount of blood usually decreases and the color of the blood usually gets lighter. However, the flow of bright red blood may increase if you have been too active. If you need to use more than one pad in an hour because your pad gets soaked, or if you pass a large clot: °· Lie down. °· Raise your feet. °· Place a cold compress on your lower abdomen. °· Rest. °· Call your health care provider. ° °If you are breastfeeding, your period should return anytime between 8 weeks after delivery and the time that you stop breastfeeding. If you are not breastfeeding, your period should return 6-8 weeks after delivery. °PERINEAL CARE °The perineal area, or perineum, is the part of your body between your thighs. After delivery, this area needs special care. Follow these instructions as told by your health care provider. °· Take warm tub baths for 15-20 minutes. °· Use medicated pads and pain-relieving sprays and creams as told. °· Do not use tampons or douches until vaginal bleeding has stopped. °· Each time you go to the bathroom: °? Use a peri bottle. °? Change your pad. °? Use towelettes in place of toilet paper until your stitches have healed. °· Do Kegel exercises  every day. Kegel exercises help to maintain the muscles that support the vagina, bladder, and bowels. You can do these exercises while you are standing, sitting, or lying down. To do Kegel exercises: °? Tighten the muscles of your abdomen and the muscles that surround your birth canal. °? Hold for a few seconds. °? Relax. °? Repeat until you have done this 5 times in a row. °· To prevent hemorrhoids from developing or getting worse: °? Drink enough fluid to keep your urine clear or pale yellow. °? Avoid straining when having a bowel movement. °? Take over-the-counter medicines and stool softeners as told by your health care provider. ° °BREAST CARE °· Wear a tight-fitting bra. °· Avoid taking over-the-counter pain medicine for breast discomfort. °· Apply ice to the breasts to help with discomfort as needed: °? Put ice in a plastic bag. °? Place a towel between your skin and the bag. °? Leave the ice on for 20 minutes or as told by your health care provider. ° °NUTRITION °· Eat a well-balanced diet. °· Do not try to lose weight quickly by cutting back on calories. °· Take your prenatal vitamins until your postpartum checkup or until your health care provider tells you to stop. ° °POSTPARTUM DEPRESSION °You may find yourself crying for no apparent reason and unable to cope with all of the changes that come with having a newborn. This mood is called postpartum depression. Postpartum depression happens because your hormone   levels change after delivery. If you have postpartum depression, get support from your partner, friends, and family. If the depression does not go away on its own after several weeks, contact your health care provider. BREAST SELF-EXAM Do a breast self-exam each month, at the same time of the month. If you are breastfeeding, check your breasts just after a feeding, when your breasts are less full. If you are breastfeeding and your period has started, check your breasts on day 5, 6, or 7 of your  period. Report any lumps, bumps, or discharge to your health care provider. Know that breasts are normally lumpy if you are breastfeeding. This is temporary, and it is not a health risk. INTIMACY AND SEXUALITY Avoid sexual activity for at least 3-4 weeks after delivery or until the brownish-red vaginal flow is completely gone. If you want to avoid pregnancy, use some form of birth control. You can get pregnant after delivery, even if you have not had your period. SEEK MEDICAL CARE IF:  You feel unable to cope with the changes that a child brings to your life, and these feelings do not go away after several weeks.  You notice a lump, a bump, or discharge on your breast.  SEEK IMMEDIATE MEDICAL CARE IF:  Blood soaks your pad in 1 hour or less.  You have: ? Severe pain or cramping in your lower abdomen. ? A bad-smelling vaginal discharge. ? A fever that is not controlled by medicine. ? A fever, and an area of your breast is red and sore. ? Pain or redness in your calf. ? Sudden, severe chest pain. ? Shortness of breath. ? Painful or bloody urination. ? Problems with your vision.  You vomit for 12 hours or longer.  You develop a severe headache.  You have serious thoughts about hurting yourself, your child, or anyone else.  This information is not intended to replace advice given to you by your health care provider. Make sure you discuss any questions you have with your health care provider. Document Released: 10/01/2000 Document Revised: 03/11/2016 Document Reviewed: 04/07/2015 Elsevier Interactive Patient Education  2017 [place].    Intrauterine Device Information An intrauterine device (IUD) is inserted into your uterus to prevent pregnancy. There are two types of IUDs available:  Copper IUD--This type of IUD is wrapped in copper wire and is placed inside the uterus. Copper makes the uterus and fallopian tubes produce a fluid that kills sperm. The copper IUD can stay in  place for 10 years.  Hormone IUD--This type of IUD contains the hormone progestin (synthetic progesterone). The hormone thickens the cervical mucus and prevents sperm from entering the uterus. It also thins the uterine lining to prevent implantation of a fertilized egg. The hormone can weaken or kill the sperm that get into the uterus. One type of hormone IUD can stay in place for 5 years, and another type can stay in place for 3 years.  Your health care provider will make sure you are a good candidate for a contraceptive IUD. Discuss with your health care provider the possible side effects. Advantages of an intrauterine device  IUDs are highly effective, reversible, long acting, and low maintenance.  There are no estrogen-related side effects.  An IUD can be used when breastfeeding.  IUDs are not associated with weight gain.  The copper IUD works immediately after insertion.  The hormone IUD works right away if inserted within 7 days of your period starting. You will need to use a  backup method of birth control for 7 days if the hormone IUD is inserted at any other time in your cycle.  The copper IUD does not interfere with your female hormones.  The hormone IUD can make heavy menstrual periods lighter and decrease cramping.  The hormone IUD can be used for 3 or 5 years.  The copper IUD can be used for 10 years. Disadvantages of an intrauterine device  The hormone IUD can be associated with irregular bleeding patterns.  The copper IUD can make your menstrual flow heavier and more painful.  You may experience cramping and vaginal bleeding after insertion. This information is not intended to replace advice given to you by your health care provider. Make sure you discuss any questions you have with your health care provider. Document Released: 09/07/2004 Document Revised: 03/11/2016 Document Reviewed: 03/25/2013 Elsevier Interactive Patient Education  2017 [employer]  Iron-Rich  Diet Iron is a mineral that helps your body to produce hemoglobin. Hemoglobin is a protein in your red blood cells that carries oxygen to your body's tissues. Eating too little iron may cause you to feel weak and tired, and it can increase your risk for infection. Eating enough iron is necessary for your body's metabolism, muscle function, and nervous system. Iron is naturally found in many foods. It can also be added to foods or fortified in foods. There are two types of dietary iron:  Heme iron. Heme iron is absorbed by the body more easily than nonheme iron. Heme iron is found in meat, poultry, and fish.  Nonheme iron. Nonheme iron is found in dietary supplements, iron-fortified grains, beans, and vegetables.  You may need to follow an iron-rich diet if:  You have been diagnosed with iron deficiency or iron-deficiency anemia.  You have a condition that prevents you from absorbing dietary iron, such as: ? Infection in your intestines. ? Celiac disease. This involves long-lasting (chronic) inflammation of your intestines.  You do not eat enough iron.  You eat a diet that is high in foods that impair iron absorption.  You have lost a lot of blood.  You have heavy bleeding during your menstrual cycle.  You are pregnant.  What is my plan? Your health care provider may help you to determine how much iron you need per day based on your condition. Generally, when a person consumes sufficient amounts of iron in the diet, the following iron needs are met:  Men. ? 47-83 years old: 11 mg per day. ? 10-41 years old: 8 mg per day.  Women. ? 37-56 years old: 15 mg per day. ? 32-22 years old: 18 mg per day. ? Over 27 years old: 8 mg per day. ? Pregnant women: 27 mg per day. ? Breastfeeding women: 9 mg per day.  What do I need to know about an iron-rich diet?  Eat fresh fruits and vegetables that are high in vitamin C along with foods that are high in iron. This will help increase the  amount of iron that your body absorbs from food, especially with foods containing nonheme iron. Foods that are high in vitamin C include oranges, peppers, tomatoes, and mango.  Take iron supplements only as directed by your health care provider. Overdose of iron can be life-threatening. If you were prescribed iron supplements, take them with orange juice or a vitamin C supplement.  Cook foods in pots and pans that are made from iron.  Eat nonheme iron-containing foods alongside foods that are high in heme iron.  This helps to improve your iron absorption.  Certain foods and drinks contain compounds that impair iron absorption. Avoid eating these foods in the same meal as iron-rich foods or with iron supplements. These include: ? Coffee, black tea, and red wine. ? Milk, dairy products, and foods that are high in calcium. ? Beans, soybeans, and peas. ? Whole grains.  When eating foods that contain both nonheme iron and compounds that impair iron absorption, follow these tips to absorb iron better. ? Soak beans overnight before cooking. ? Soak whole grains overnight and drain them before using. ? Ferment flours before baking, such as using yeast in bread dough. What foods can I eat? Grains Iron-fortified breakfast cereal. Iron-fortified whole-wheat bread. Enriched rice. Sprouted grains. Vegetables Spinach. Potatoes with skin. Green peas. Broccoli. Red and green bell peppers. Fermented vegetables. Fruits Prunes. Raisins. Oranges. Strawberries. Mango. Grapefruit. Meats and Other Protein Sources Beef liver. Oysters. Beef. Shrimp. [place]. Chicken. [place]. Sardines. Chickpeas. Nuts. Tofu. Beverages Tomato juice. Fresh orange juice. Prune juice. Hibiscus tea. Fortified instant breakfast shakes. Condiments Tahini. Fermented soy sauce. Sweets and Desserts Black-strap molasses. Other Wheat germ. The items listed above may not be a complete list of recommended foods or beverages. Contact your  dietitian for more options. What foods are not recommended? Grains Whole grains. Bran cereal. Bran flour. Oats. Vegetables Artichokes. Brussels sprouts. Kale. Fruits Blueberries. Raspberries. Strawberries. Figs. Meats and Other Protein Sources Soybeans. Products made from soy protein. Dairy Milk. Cream. Cheese. Yogurt. Cottage cheese. Beverages Coffee. Black tea. Red wine. Sweets and Desserts Cocoa. Chocolate. Ice cream. Other Basil. Oregano. Parsley. The items listed above may not be a complete list of foods and beverages to avoid. Contact your dietitian for more information. This information is not intended to replace advice given to you by your health care provider. Make sure you discuss any questions you have with your health care provider. Document Released: 05/18/2005 Document Revised: 04/23/2016 Document Reviewed: 05/01/2014 Elsevier Interactive Patient Education  [employer].

## 2017-07-02 NOTE — Discharge Summary (Signed)
Obstetric Discharge Summary Reason for Admission: scheduled repeat C/S Prenatal Procedures: none Intrapartum Procedures: cesarean: low cervical, transverse Postpartum Procedures: none Complications-Operative and Postpartum: none Hemoglobin  Date Value Ref Range Status  06/30/2017 10.1 (L) 12.0 - 15.0 g/dL Final   HCT  Date Value Ref Range Status  06/30/2017 30.8 (L) 36.0 - 46.0 % Final    Physical Exam:  General: alert, cooperative and appears stated age 31: appropriate Uterine Fundus: firm Incision: CDI honeycomb DVT Evaluation: No evidence of DVT seen on physical exam.  Discharge Diagnoses: Term Pregnancy-delivered  Discharge Information: Date: 07/02/2017 Activity: unrestricted Diet: routine Medications: PNV and Oxycodone 5 mg #20 No refills Condition: stable Instructions: refer to practice specific booklet Discharge to: home   Newborn Data: Live born female  Birth Weight: 8 lb 9.7 oz (3905 g) APGAR: 8, 9  Home with mother.  Lori A Clemmons CNM 07/02/2017, 10:11 AM

## 2017-07-02 NOTE — Lactation Note (Signed)
This note was copied from a baby's chart. Lactation Consultation Note  Patient Name: Julia Ruiz [id number] Date: 07/02/2017 Reason for consult: Follow-up assessment;Infant weight loss;Nipple pain/trauma  Baby is 56 hours old.  LC reviewed and updated doc flow sheets. When LC entered room , mom had pumped off `15 ml. And parents desired to feed with  Artifical nipple. LC showed dad how to PACE feed. Baby  Tolerated well.  And baby still acting hungry, LC offered to assist with latching on the right breast due to the left being sore.  LC provided adequate support ( pillows ) for a deep latch. LC reviewed steps for latching and the importance of  Depth. Multiple swallows noted , and increased with breast compressions. Baby still feeding at 10 mins.  Per mom comfortable.  Sore nipple and engorgement prevention and  tx reviewed. LC recommended prior to latch - breast massage,  Hand express, pre-pump if needed ( when milk comes in ) and latch with breast compressions until swallows.  Due to 7% weight loss , can add extra post pumping both breast after 3-4 feedings a day and prn.  Sore nipple and engorgement prevention and tx reviewed .  LC instructed mom on the use comfort gels for after the feedings, shells for when the comfort gels are in  The refrigerator, except when sleeping.  Per mom has  DEBP at home.  Mother informed of post-discharge support and given phone number to the lactation department, including services for phone call assistance; out-patient appointments; and breastfeeding support group. List of other breastfeeding resources in the community given in the handout. Encouraged mother to call for problems or concerns related to breastfeeding.   Maternal Data Has patient been taught Hand Expression?: Yes  Feeding Feeding Type: Breast Fed Length of feed:  (STILL FEEDING AT 10 MINS , MULTIPLE SWALLOWS)  LATCH Score Latch: Grasps breast easily, tongue down, lips flanged,  rhythmical sucking.  Audible Swallowing: Spontaneous and intermittent  Type of Nipple: Everted at rest and after stimulation  Comfort (Breast/Nipple): Filling, red/small blisters or bruises, mild/mod discomfort  Hold (Positioning): Assistance needed to correctly position infant at breast and maintain latch.  LATCH Score: 8  Interventions Interventions: Breast feeding basics reviewed;Assisted with latch;Breast massage;Hand express;Breast compression;Adjust position;Support pillows;Position options;Expressed milk;Shells;Coconut oil;Comfort gels;Hand pump;DEBP  Lactation Tools Discussed/Used Tools: Shells;Comfort gels;Coconut oil;Pump Shell Type: Inverted Breast pump type: Manual;Double-Electric Breast Pump (PER MOM HAD BEEN PUMPING ) WIC Program: No Pump Review: Setup, frequency, and cleaning   Consult Status Consult Status: Complete Date: 07/02/17 Follow-up type: In-patient    Matilde Sprang Nickalas Mccarrick 07/02/2017, 12:34 PM

## 2017-07-07 ENCOUNTER — Encounter: Payer: Self-pay | Admitting: Family Medicine

## 2017-07-11 ENCOUNTER — Encounter (HOSPITAL_COMMUNITY): Payer: Self-pay

## 2017-07-11 ENCOUNTER — Inpatient Hospital Stay (HOSPITAL_COMMUNITY)
Admission: AD | Admit: 2017-07-11 | Discharge: 2017-07-11 | Disposition: A | Discharge status: Home / Self Care | Payer: 59 | Source: Ambulatory Visit | Attending: Obstetrics and Gynecology | Admitting: Obstetrics and Gynecology

## 2017-07-11 DIAGNOSIS — R030 Elevated blood-pressure reading, without diagnosis of hypertension: Secondary | ICD-10-CM | POA: Diagnosis present

## 2017-07-11 DIAGNOSIS — O34211 Maternal care for low transverse scar from previous cesarean delivery: Secondary | ICD-10-CM | POA: Diagnosis not present

## 2017-07-11 DIAGNOSIS — O165 Unspecified maternal hypertension, complicating the puerperium: Secondary | ICD-10-CM

## 2017-07-11 DIAGNOSIS — O135 Gestational [pregnancy-induced] hypertension without significant proteinuria, complicating the puerperium: Secondary | ICD-10-CM | POA: Insufficient documentation

## 2017-07-11 LAB — COMPREHENSIVE METABOLIC PANEL
ALT: 38 U/L (ref 14–54)
AST: 27 U/L (ref 15–41)
Albumin: 3.2 g/dL — ABNORMAL LOW (ref 3.5–5.0)
Alkaline Phosphatase: 88 U/L (ref 38–126)
Anion gap: 9 (ref 5–15)
BILIRUBIN TOTAL: 1.2 mg/dL (ref 0.3–1.2)
BUN: 11 mg/dL (ref 6–20)
CALCIUM: 9 mg/dL (ref 8.9–10.3)
CO2: 24 mmol/L (ref 22–32)
CREATININE: 0.88 mg/dL (ref 0.44–1.00)
Chloride: 105 mmol/L (ref 101–111)
GFR calc Af Amer: >60 mL/min (ref >60)
Glucose, Bld: 89 mg/dL (ref 65–99)
Potassium: 3.9 mmol/L (ref 3.5–5.1)
Sodium: 138 mmol/L (ref 135–145)
TOTAL PROTEIN: 6.8 g/dL (ref 6.5–8.1)

## 2017-07-11 LAB — CBC
HEMATOCRIT: 36.7 % (ref 36.0–46.0)
Hemoglobin: 11.9 g/dL — ABNORMAL LOW (ref 12.0–15.0)
MCH: 27.5 pg (ref 26.0–34.0)
MCHC: 32.4 g/dL (ref 30.0–36.0)
MCV: 85 fL (ref 78.0–100.0)
Platelets: 360 10*3/uL (ref 150–400)
RBC: 4.32 MIL/uL (ref 3.87–5.11)
RDW: 13.9 % (ref 11.5–15.5)
WBC: 5.7 10*3/uL (ref 4.0–10.5)

## 2017-07-11 LAB — URIC ACID: Uric Acid, Serum: 5.8 mg/dL (ref 2.3–6.6)

## 2017-07-11 LAB — PROTEIN / CREATININE RATIO, URINE
CREATININE, URINE: 113 mg/dL
Total Protein, Urine: <6 mg/dL

## 2017-07-11 LAB — LACTATE DEHYDROGENASE: LDH: 155 U/L (ref 98–192)

## 2017-07-11 MED ORDER — ACETAMINOPHEN 325 MG PO TABS: 650.0000 mg | ORAL_TABLET | Freq: Once | ORAL | Status: DC

## 2017-07-11 MED ORDER — LABETALOL HCL 100 MG PO TABS: 100.0000 mg | ORAL_TABLET | Freq: Two times a day (BID) | ORAL | 1 refills | Status: DC

## 2017-07-11 MED ORDER — METOCLOPRAMIDE HCL 10 MG PO TABS: 10.0000 mg | ORAL_TABLET | Freq: Three times a day (TID) | ORAL | 0 refills | Status: DC

## 2017-07-11 NOTE — Lactation Note (Signed)
Lactation Consultation Note  Patient Name: Julia Ruiz [id number] Date: 07/11/2017   Infant is 29 days old and has continued to lose weight. She is now 7# 14oz, which is 8.5% below BW. Parents report excellent output, but Mom says infant is always at the breast. This is Mom's 3rd child, but the 1st one that she has attempted to breastfeed exclusively.  A breast exam was done. Her breasts were not full & ductal tissue was not palpable in the superior portions of her breasts, bilaterally. Mom does not remember much fullness when her milk came to volume (compared to her previous 2 children). Lochia is WNL. Mom is interested in a galactagogue. I spoke w/Nancy, CNM who called in an Rx for metoclopramide  tid x 14 days. Potential ADEs discussed w/parents, as well as reasons to discontinue medicine & call OB office.  Mom understands that the metoclopramide is likely to increase her milk supply somewhat, but may not be enough for Mom to be able to exclusively breastfeed. I recommended that they begin supplementing with formula this evening & to give formula whenever infant is not content after breastfeeding. Mom was also given the phone # to make an appt w/Lactation so that pre-& post-weights can be done. Mom was also encouraged to call her pediatrician's office.    Lurline Hare [facility] 07/11/2017, 9:46 PM

## 2017-07-11 NOTE — MAU Note (Signed)
Pt post partum- c-section delivered on 9/12. Had home visit today and told her B/P was high and needed to come in. Reports a slight headache and seeing spots. Stated her feet were not swollen before she had the baby but they are now.

## 2017-07-11 NOTE — Discharge Instructions (Signed)
Postpartum Hypertension °Postpartum hypertension is high blood pressure after pregnancy that remains higher than normal for more than two days after delivery. You may not realize that you have postpartum hypertension if your blood pressure is not being checked regularly. In some cases, postpartum hypertension will go away on its own, usually within a week of delivery. However, for some women, medical treatment is required to prevent serious complications, such as seizures or stroke. °The following things can affect your blood pressure: °· The type of delivery you had. °· Having received IV fluids or other medicines during or after delivery. ° °What are the causes? °Postpartum hypertension may be caused by any of the following or by a combination of any of the following: °· Hypertension that existed before pregnancy (chronic hypertension). °· Gestational hypertension. °· Preeclampsia or eclampsia. °· Receiving a lot of fluid through an IV during or after delivery. °· Medicines. °· HELLP syndrome. °· Hyperthyroidism. °· Stroke. °· Other rare neurological or blood disorders. ° °In some cases, the cause may not be known. °What increases the risk? °Postpartum hypertension can be related to one or more risk factors, such as: °· Chronic hypertension. In some cases, this may not have been diagnosed before pregnancy. °· Obesity. °· Type 2 diabetes. °· Kidney disease. °· Family history of preeclampsia. °· Other medical conditions that cause hormonal imbalances. ° °What are the signs or symptoms? °As with all types of hypertension, postpartum hypertension may not have any symptoms. Depending on how high your blood pressure is, you may experience: °· Headaches. These may be mild, moderate, or severe. They may also be steady, constant, or sudden in onset (thunderclap headache). °· Visual changes. °· Dizziness. °· Shortness of breath. °· Swelling of your hands, feet, lower legs, or face. In some cases, you may have swelling in  more than one of these locations. °· Heart palpitations or a racing heartbeat. °· Difficulty breathing while lying down. °· Decreased urination. ° °Other rare signs and symptoms may include: °· Sweating more than usual. This lasts longer than a few days after delivery. °· Chest pain. °· Sudden dizziness when you get up from sitting or lying down. °· Seizures. °· Nausea or vomiting. °· Abdominal pain. ° °How is this diagnosed? °The diagnosis of postpartum hypertension is made through a combination of physical examination findings and testing of your blood and urine. You may also have additional tests, such as a CT scan or an MRI, to check for other complications of postpartum hypertension. °How is this treated? °When blood pressure is high enough to require treatment, your options may include: °· Medicines to reduce blood pressure (antihypertensives). Tell your health care provider if you are breastfeeding or if you plan to breastfeed. There are many antihypertensive medicines that are safe to take while breastfeeding. °· Stopping medicines that may be causing hypertension. °· Treating medical conditions that are causing hypertension. °· Treating the complications of hypertension, such as seizures, stroke, or kidney problems. ° °Your health care provider will also continue to monitor your blood pressure closely and repeatedly until it is within a safe range for you. °Follow these instructions at home: °· Take medicines only as directed by your health care provider. °· Get regular exercise after your health care provider tells you that it is safe. °· Follow your health care provider’s recommendations on fluid and salt restrictions. °· Do not use any tobacco products, including cigarettes, chewing tobacco, or electronic cigarettes. If you need help quitting, ask your health care provider. °·   Keep all follow-up visits as directed by your health care provider. This is important. °Contact a health care provider  if: °· Your symptoms get worse. °· You have new symptoms, such as: °? Headache. °? Dizziness. °? Visual changes. °Get help right away if: °· You develop a severe or sudden headache. °· You have seizures. °· You develop numbness or weakness on one side of your body. °· You have difficulty thinking, speaking, or swallowing. °· You develop severe abdominal pain. °· You develop difficulty breathing, chest pain, a racing heartbeat, or heart palpitations. °These symptoms may represent a serious problem that is an emergency. Do not wait to see if the symptoms will go away. Get medical help right away. Call your local emergency services (911 in the [place] Do not drive yourself to the hospital. °This information is not intended to replace advice given to you by your health care provider. Make sure you discuss any questions you have with your health care provider. °Document Released: 06/07/2014 Document Revised: 03/08/2016 Document Reviewed: 04/18/2014 °Elsevier Interactive Patient Education © 2018 [employer] ° °

## 2017-07-11 NOTE — MAU Note (Signed)
Chief Complaint: Hypertension   None    SUBJECTIVE HPI: Julia Ruiz is a 31 y.o. G3P3003 at [redacted]w[redacted]d who presents to [facility] reporting with high blood pressure.  Pt states was seen by the visiting nurse and was noted to have high blood pressure.  Pt had repeat  LTCS on 06/29/2017.  Pt denies problems with surgery.  zPt breastfeeding infant.  Was told today that baby has lost weight.  Pt upset.  Pt states has slight headache.  No blurred vision or epigastric pain.  Pt has not taken anything for pain today.    Location: headache Quality: mild Severity: 3/10 on pain scale Duration: today Modifying factors: sleep   Past Medical History:  Diagnosis Date  . Abnormal cervical Papanicolaou smear    as teenager, normal since per her report  . Complication of anesthesia    spinal headache  . Gestational diabetes    with first pregnancy    OB History  Gravida Para Term Preterm AB Living  0 0 3  SAB TAB Ectopic Multiple Live Births  0 0 0 0 3    # Outcome Date GA Lbr Len/2nd Weight Sex Delivery Anes PTL Lv  3 Term 06/29/17 [redacted]w[redacted]d  3.905 kg (8 lb 9.7 oz) F CS-LTranv EPI  LIV     Birth Comments: moderate sized umbilical hernia  2 Term 2012 [redacted]w[redacted]d  3.884 kg (8 lb 9 oz) F CS-LTranv Spinal  LIV  1 Term 2006 [redacted]w[redacted]d  5.245 kg (11 lb 9 oz) M CS-LTranv Spinal  LIV     Past Surgical History:  Procedure Laterality Date  . CESAREAN SECTION    . CESAREAN SECTION    . CESAREAN SECTION N/A 06/29/2017   Procedure: REPEAT CESAREAN SECTION;  Surgeon: Osborn Coho, MD;  Location: [facility] BIRTHING SUITES;  Service: Obstetrics;  Laterality: N/A;  Tracey RNFA   Social History   Social History  . Marital status: Married    Spouse name: N/A  . Number of children: N/A  . Years of education: N/A   Occupational History  . Not on file.   Social History Main Topics  . Smoking status: Never Smoker  . Smokeless tobacco: Never Used  . Alcohol use No     Comment: 2 glasees of wine occ  . Drug  use: No  . Sexual activity: Not on file   Other Topics Concern  . Not on file   Social History Narrative   ** Merged History Encounter **       Work or School: Airline pilot - registration      Home Situation: lives husband and 2 children      Spiritual Beliefs: Christian      Lifestyle: no regular exercise; diet is good   Family History  Problem Relation Age of Onset  . Mental illness Brother   . Irritable bowel syndrome Mother    No current facility-administered medications on file prior to encounter.    Current Outpatient Prescriptions on File Prior to Encounter  Medication Sig Dispense Refill  . ibuprofen (ADVIL,MOTRIN) 600 MG tablet Take 1 tablet (600 mg total) by mouth every 6 (six) hours as needed. 30 tablet 2  . Multiple Vitamin (MULTIVITAMIN WITH MINERALS) TABS tablet Take 1 tablet by mouth daily.    Marland Kitchen oxyCODONE (OXY IR/ROXICODONE) 5 MG immediate release tablet Take 1 tablet (5 mg total) by mouth every 4 (four) hours as needed (pain scale 4-7). 20 tablet 0   Allergies  Allergen Reactions  . Tetracyclines & Related Hives, Shortness Of Breath and Other (See Comments)    wheezing  . Clindagel [Clindamycin] Itching    I have reviewed patient's Past Medical Hx, Surgical Hx, Family Hx, Social Hx, medications and allergies.   Review of Systems  Constitutional: Negative.   HENT: Negative.   Eyes: Negative.   Respiratory: Negative.   Cardiovascular: Negative.   Gastrointestinal: Negative.   Endocrine: Negative.   Genitourinary: Negative.   Musculoskeletal: Negative.   Allergic/Immunologic: Negative.   Neurological: Positive for headaches.  Hematological: Negative.   Psychiatric/Behavioral: Negative.     OBJECTIVE Patient Vitals for the past 24 hrs:  BP Temp Pulse Resp SpO2 Height Weight  07/11/17 1946 (!) 137/92 - 63 - - - -  07/11/17 1935 (!) 155/89 - (!) 57 - - - -  07/11/17 1900 (!) 147/99 - (!) 57 - - - -  07/11/17 1847 (!) 149/102 - 60 - - - -   07/11/17 1845 (!) 153/92 - 64 - - - -  07/11/17 1709 (!) 141/90 98.4 F (36.9 C) 67 18 98 %  (1.702 m) 103 kg (227 lb)   Constitutional: Well-developed, well-nourished female in no acute distress.  Cardiovascular: normal rate Respiratory: normal rate and effort.  GI: Abd soft, non-tender, incision healed, steri strips coming off, slight redness noted  MS: Extremities nontender, no edema, normal ROM Neurologic: Alert and oriented x 4.  Incision cleaned with 1/2 and 1/2 saline and hydrogen peroxide Results for orders placed or performed during the hospital encounter of 07/11/17 (from the past 24 hour(s))  CBC     Status: Abnormal   Collection Time: 07/11/17  5:35 PM  Result Value Ref Range   WBC 5.7 4.0 - 10.5 K/uL   RBC 4.32 3.87 - 5.11 MIL/uL   Hemoglobin 11.9 (L) 12.0 - 15.0 g/dL   HCT 16.1 09.6 - 04.5 %   MCV 85.0 78.0 - 100.0 fL   MCH 27.5 26.0 - 34.0 pg   MCHC 32.4 30.0 - 36.0 g/dL   RDW 40.9 81.1 - 91.4 %   Platelets 360 150 - 400 K/uL  Comprehensive metabolic panel     Status: Abnormal   Collection Time: 07/11/17  5:35 PM  Result Value Ref Range   Sodium 138 135 - 145 mmol/L   Potassium 3.9 3.5 - 5.1 mmol/L   Chloride 105 101 - 111 mmol/L   CO2 24 22 - 32 mmol/L   Glucose, Bld 89 65 - 99 mg/dL   BUN 11 6 - 20 mg/dL   Creatinine, Ser 7.82 0.44 - 1.00 mg/dL   Calcium 9.0 8.9 - 95.6 mg/dL   Total Protein 6.8 6.5 - 8.1 g/dL   Albumin 3.2 (L) 3.5 - 5.0 g/dL   AST 27 15 - 41 U/L   ALT 38 14 - 54 U/L   Alkaline Phosphatase 88 38 - 126 U/L   Total Bilirubin 1.2 0.3 - 1.2 mg/dL   GFR calc non Af Amer >60 >60 mL/min   GFR calc Af Amer >60 >60 mL/min   Anion gap 9 5 - 15  Uric acid     Status: None   Collection Time: 07/11/17  5:35 PM  Result Value Ref Range   Uric Acid, Serum 5.8 2.3 - 6.6 mg/dL  Lactate dehydrogenase     Status: None   Collection Time: 07/11/17  5:35 PM  Result Value Ref Range   LDH 155 98 - 192 U/L  UPC  negative  IMAGING No results  found.  MAU COURSE Orders Placed This Encounter  Procedures  . CBC  . Comprehensive metabolic panel  . Protein / creatinine ratio, urine  . Uric acid  . Lactate dehydrogenase   Meds ordered this encounter  Medications  . acetaminophen (TYLENOL) tablet 650 mg    MDM PE, Labs, lactation consultation.   ASSESSMENT Postpartum hypertension   PLAN Lactation for decreased milk supply Instructions for care of incision Start on Labetalol  BID F/U in office early next week. Worsening dangers signs and symptoms discussed Dr. Su Hilt aware of poc. Discharge home in stable condition.      Kenney Houseman, CNM 07/11/2017  8:04 PM

## 2017-07-25 DIAGNOSIS — Z760 Encounter for issue of repeat prescription: Secondary | ICD-10-CM | POA: Diagnosis not present

## 2017-08-22 DIAGNOSIS — Z113 Encounter for screening for infections with a predominantly sexual mode of transmission: Secondary | ICD-10-CM | POA: Diagnosis not present

## 2017-09-06 DIAGNOSIS — Z3040 Encounter for surveillance of contraceptives, unspecified: Secondary | ICD-10-CM | POA: Diagnosis not present

## 2017-09-06 DIAGNOSIS — Z30430 Encounter for insertion of intrauterine contraceptive device: Secondary | ICD-10-CM | POA: Diagnosis not present

## 2017-10-14 DIAGNOSIS — Z30431 Encounter for routine checking of intrauterine contraceptive device: Secondary | ICD-10-CM | POA: Diagnosis not present

## 2017-10-14 DIAGNOSIS — N760 Acute vaginitis: Secondary | ICD-10-CM | POA: Diagnosis not present

## 2017-10-27 ENCOUNTER — Encounter: Payer: Self-pay | Admitting: Family Medicine

## 2017-12-27 ENCOUNTER — Encounter (HOSPITAL_COMMUNITY): Payer: Self-pay | Admitting: Emergency Medicine

## 2017-12-27 DIAGNOSIS — R197 Diarrhea, unspecified: Secondary | ICD-10-CM | POA: Insufficient documentation

## 2017-12-27 DIAGNOSIS — R404 Transient alteration of awareness: Secondary | ICD-10-CM | POA: Diagnosis not present

## 2017-12-27 DIAGNOSIS — R55 Syncope and collapse: Secondary | ICD-10-CM | POA: Insufficient documentation

## 2017-12-27 DIAGNOSIS — R110 Nausea: Secondary | ICD-10-CM | POA: Diagnosis not present

## 2017-12-27 LAB — I-STAT BETA HCG BLOOD, ED (MC, WL, AP ONLY): I-stat hCG, quantitative: <5 m[IU]/mL (ref <5)

## 2017-12-27 LAB — I-STAT CHEM 8, ED
BUN: 8 mg/dL (ref 6–20)
CALCIUM ION: 1.12 mmol/L — AB (ref 1.15–1.40)
CHLORIDE: 105 mmol/L (ref 101–111)
CREATININE: 0.8 mg/dL (ref 0.44–1.00)
GLUCOSE: 116 mg/dL — AB (ref 65–99)
HCT: 43 % (ref 36.0–46.0)
Hemoglobin: 14.6 g/dL (ref 12.0–15.0)
POTASSIUM: 3.8 mmol/L (ref 3.5–5.1)
Sodium: 140 mmol/L (ref 135–145)
TCO2: 24 mmol/L (ref 22–32)

## 2017-12-27 LAB — CBC WITH DIFFERENTIAL/PLATELET
BASOS ABS: 0 10*3/uL (ref 0.0–0.1)
Basophils Relative: 0 %
Eosinophils Absolute: 0.1 10*3/uL (ref 0.0–0.7)
Eosinophils Relative: 1 %
HEMATOCRIT: 39.9 % (ref 36.0–46.0)
Hemoglobin: 13 g/dL (ref 12.0–15.0)
LYMPHS ABS: 0.8 10*3/uL (ref 0.7–4.0)
Lymphocytes Relative: 9 %
MCH: 28.5 pg (ref 26.0–34.0)
MCHC: 32.6 g/dL (ref 30.0–36.0)
MCV: 87.5 fL (ref 78.0–100.0)
MONO ABS: 0.3 10*3/uL (ref 0.1–1.0)
Monocytes Relative: 3 %
NEUTROS ABS: 8.2 10*3/uL — AB (ref 1.7–7.7)
Neutrophils Relative %: 87 %
Platelets: 292 10*3/uL (ref 150–400)
RBC: 4.56 MIL/uL (ref 3.87–5.11)
RDW: 12.9 % (ref 11.5–15.5)
WBC: 9.5 10*3/uL (ref 4.0–10.5)

## 2017-12-27 NOTE — ED Triage Notes (Addendum)
Per EMS, patient c/o syncopal episode this evening. Reports fall and hitting her head on a table. Denies taking blood thinners. C/o N/V/D today. Reports she is breast feeding. + orthostatics. Denies chest pain.  20g L AC NS

## 2017-12-28 ENCOUNTER — Emergency Department (HOSPITAL_COMMUNITY): Payer: 59

## 2017-12-28 ENCOUNTER — Emergency Department (HOSPITAL_COMMUNITY)
Admission: EM | Admit: 2017-12-28 | Discharge: 2017-12-28 | Disposition: A | Discharge status: Home / Self Care | Payer: 59 | Attending: Emergency Medicine | Admitting: Emergency Medicine

## 2017-12-28 DIAGNOSIS — R197 Diarrhea, unspecified: Secondary | ICD-10-CM | POA: Diagnosis not present

## 2017-12-28 DIAGNOSIS — R55 Syncope and collapse: Secondary | ICD-10-CM

## 2017-12-28 DIAGNOSIS — S0990XA Unspecified injury of head, initial encounter: Secondary | ICD-10-CM | POA: Diagnosis not present

## 2017-12-28 MED ORDER — IOPAMIDOL (ISOVUE-370) INJECTION 76%
100.0000 mL | Freq: Once | INTRAVENOUS | Status: AC | PRN
  Administered 2017-12-28: 100 mL via INTRAVENOUS

## 2017-12-28 MED ORDER — SODIUM CHLORIDE 0.9 % IJ SOLN
INTRAMUSCULAR | Status: AC
  Filled 2017-12-28: qty 50

## 2017-12-28 MED ORDER — SODIUM CHLORIDE 0.9 % IV BOLUS (SEPSIS)
1000.0000 mL | Freq: Once | INTRAVENOUS | Status: AC
  Administered 2017-12-28: 1000 mL via INTRAVENOUS

## 2017-12-28 MED ORDER — IOPAMIDOL (ISOVUE-370) INJECTION 76%
INTRAVENOUS | Status: AC
  Filled 2017-12-28: qty 100

## 2017-12-28 NOTE — ED Notes (Signed)
Pt attempted to give urine sample but got toilet paper in sample; pt will let staff know when she is able to void again.

## 2017-12-28 NOTE — ED Provider Notes (Signed)
[facility]   CSN: [id number] Arrival date & time: 12/27/17  2305     History   Chief Complaint Chief Complaint  Patient presents with  . Loss of Consciousness    HPI Julia Ruiz is a 32 y.o. female.  The history is provided by the patient.  Diarrhea   This is a new problem. The current episode started 3 to 5 hours ago. The problem occurs 2 to 4 times per day. The problem has not changed since onset.The stool consistency is described as watery. There has been no fever. Pertinent negatives include no abdominal pain, no vomiting and no chills. She has tried nothing for the symptoms. The treatment provided no relief. Risk factors include ill contacts. Her past medical history does not include irritable bowel syndrome.  Loss of Consciousness   This is a new problem. The current episode started 3 to 5 hours ago. The problem occurs constantly. The problem has been resolved. She lost consciousness for a period of less than one minute. The problem is associated with bowel movements. Associated symptoms include nausea. Pertinent negatives include abdominal pain, chest pain, congestion, focal weakness, light-headedness, palpitations and vomiting. She has tried nothing for the symptoms. The treatment provided significant relief. Her past medical history does not include CVA or seizures.  Was recently pregnant and has an IUD.  Hit head when she passed out.    Past Medical History:  Diagnosis Date  . Abnormal cervical Papanicolaou smear    as teenager, normal since per her report  . Complication of anesthesia    spinal headache  . Gestational diabetes    with first pregnancy     Patient Active Problem List   Diagnosis Date Noted  . Status post repeat low transverse cesarean section 06/29/2017  . Obesity 04/04/2014    Past Surgical History:  Procedure Laterality Date  . CESAREAN SECTION    . CESAREAN SECTION    . CESAREAN SECTION N/A  06/29/2017   Procedure: REPEAT CESAREAN SECTION;  Surgeon: Osborn Cohooberts, Angela, MD;  [facility];  Service: Obstetrics;  Laterality: N/A;  Tracey RNFA    OB History    Gravida Para Term Preterm AB Living   3 3 3  0 0 3   SAB TAB Ectopic Multiple Live Births   0 0 0 0 3       Home Medications    Prior to Admission medications   Medication Sig Start Date End Date Taking? Authorizing Provider  ibuprofen (ADVIL,MOTRIN) 600 MG tablet Take 1 tablet (600 mg total) by mouth every 6 (six) hours as needed. 07/01/17   Gerrit HeckEmly, Jessica, CNM  labetalol (NORMODYNE) 100 MG tablet Take 1 tablet (100 mg total) by mouth 2 (two) times daily. 07/11/17   Prothero, Henderson NewcomerNancy Jean, CNM  metoCLOPramide (REGLAN) 10 MG tablet Take 1 tablet (10 mg total) by mouth 3 (three) times daily with meals. 07/11/17 07/25/17  Prothero, Henderson NewcomerNancy Jean, CNM  Multiple Vitamin (MULTIVITAMIN WITH MINERALS) TABS tablet Take 1 tablet by mouth daily.    [provider]  oxyCODONE (OXY IR/ROXICODONE) 5 MG immediate release tablet Take 1 tablet (5 mg total) by mouth every 4 (four) hours as needed (pain scale 4-7). 07/02/17   Clemmons, Elmore GuiseLori A, CNM    Family History Family History  Problem Relation Age of Onset  . Mental illness Brother   . Irritable bowel syndrome Mother     Social History Social History   Tobacco Use  .  Smoking status: Never Smoker  . Smokeless tobacco: Never Used  Substance Use Topics  . Alcohol use: No    Comment: 2 glasees of wine occ  . Drug use: No     Allergies   Tetracyclines & related and Clindagel [clindamycin]   Review of Systems Review of Systems  Constitutional: Negative for chills.  HENT: Negative for congestion and facial swelling.   Respiratory: Negative for chest tightness and wheezing.   Cardiovascular: Positive for syncope. Negative for chest pain, palpitations and leg swelling.  Gastrointestinal: Positive for diarrhea and nausea. Negative for abdominal pain and vomiting.   Genitourinary: Negative for dysuria and flank pain.  Neurological: Negative for focal weakness and light-headedness.  All other systems reviewed and are negative.    Physical Exam Updated Vital Signs BP 111/68 (BP Location: Right Arm)   Pulse (!) 103   Temp 98.4 F (36.9 C) (Oral)   Resp 16   SpO2 98%   Breastfeeding? Yes Comment: negative Beta HCG 12/27/17  Physical Exam  Constitutional: She is oriented to person, place, and time. She appears well-developed and well-nourished. No distress.  HENT:  Head: Normocephalic and atraumatic.  Mouth/Throat: No oropharyngeal exudate.  Eyes: Conjunctivae are normal. Pupils are equal, round, and reactive to light.  Neck: Normal range of motion. Neck supple.  Cardiovascular: Normal rate, regular rhythm, normal heart sounds and intact distal pulses.  Pulmonary/Chest: Effort normal and breath sounds normal. No stridor. She has no wheezes. She has no rales.  Abdominal: Soft. Bowel sounds are normal. She exhibits no mass. There is no tenderness. There is no rebound and no guarding.  Musculoskeletal: Normal range of motion. She exhibits no edema or tenderness.  Neurological: She is alert and oriented to person, place, and time. She displays normal reflexes.  Skin: Skin is warm and dry. Capillary refill takes less than 2 seconds.  Psychiatric: She has a normal mood and affect.  Nursing note and vitals reviewed.    ED Treatments / Results  Labs (all labs ordered are listed, but only abnormal results are displayed) Labs Reviewed  CBC WITH DIFFERENTIAL/PLATELET - Abnormal; Notable for the following components:      Result Value   Neutro Abs 8.2 (*)    All other components within normal limits  I-STAT CHEM 8, ED - Abnormal; Notable for the following components:   Glucose, Bld 116 (*)    Calcium, Ion 1.12 (*)    All other components within normal limits  URINALYSIS, ROUTINE W REFLEX MICROSCOPIC  I-STAT BETA HCG BLOOD, ED ([facility], [facility], [facility] ONLY)     EKG  EKG Interpretation  Date/Time:  Tuesday December 27 2017 23:37:25 EDT Ventricular Rate:  86 PR Interval:    QRS Duration: 76 QT Interval:  349 QTC Calculation: 418 R Axis:   57 Text Interpretation:  Sinus rhythm Confirmed by Rajeev Escue ([id number]) on 12/27/2017 11:40:45 PM       Radiology Ct Head Wo Contrast  Result Date: 12/28/2017 CLINICAL DATA:  32 year old female with syncope and fall. EXAM: CT HEAD WITHOUT CONTRAST TECHNIQUE: Contiguous axial images were obtained from the base of the skull through the vertex without intravenous contrast. COMPARISON:  None. FINDINGS: Brain: The ventricles and sulci appropriate size for patient's age. Slight asymmetric prominence of the right lateral ventricle, likely congenital. The gray-white matter discrimination is preserved. There is no acute intracranial. No mass effect or midline shift. No extra-axial fluid collection. Vascular: No hyperdense vessel or unexpected calcification. Skull: Normal. Negative for fracture or  focal lesion. Sinuses/Orbits: Mild mucoperiosteal thickening of paranasal sinuses. No air-fluid level. The mastoid air cells are clear. Other: None IMPRESSION: Unremarkable noncontrast CT of the brain. Electronically Signed   By: Elgie Collard M.D.   On: 12/28/2017 03:52   Ct Angio Chest Pe W And/or Wo Contrast  Result Date: 12/28/2017 CLINICAL DATA:  32 year old female with syncope. EXAM: CT ANGIOGRAPHY CHEST WITH CONTRAST TECHNIQUE: Multidetector CT imaging of the chest was performed using the standard protocol during bolus administration of intravenous contrast. Multiplanar CT image reconstructions and MIPs were obtained to evaluate the vascular anatomy. CONTRAST:  ISOVUE-370 IOPAMIDOL (ISOVUE-370) INJECTION 76% COMPARISON:  None. FINDINGS: Cardiovascular: There is no cardiomegaly or pericardial effusion. The thoracic aorta is unremarkable. Evaluation of the pulmonary arteries is somewhat limited due to suboptimal  opacification and timing of the contrast as well as due to respiratory motion artifact. No definite pulmonary artery embolus identified. Mediastinum/Nodes: There is no hilar or mediastinal adenopathy. Esophagus is grossly unremarkable. No mediastinal fluid collection. Lungs/Pleura: The lungs are clear. There is no pleural effusion or pneumothorax. The central airways are patent Upper Abdomen: No acute abnormality. Musculoskeletal: None Review of the MIP images confirms the above findings. IMPRESSION: No acute intrathoracic pathology. No large or central pulmonary artery embolus. Electronically Signed   By: Elgie Collard M.D.   On: 12/28/2017 03:56    Procedures Procedures (including critical care time)  Medications Ordered in ED Medications  sodium chloride 0.9 % injection (not administered)  iopamidol (ISOVUE-370) 76 % injection (not administered)  sodium chloride 0.9 % bolus 1,000 mL (1,000 mLs Intravenous New Bag/Given 12/28/17 0303)  iopamidol (ISOVUE-370) 76 % injection 100 mL (100 mLs Intravenous Contrast Given 12/28/17 0344)       Final Clinical Impressions(s) / ED Diagnoses    Return for weakness, numbness, changes in vision or speech, fevers >100.4 unrelieved by medication, shortness of breath, intractable vomiting, or diarrhea, abdominal pain, Inability to tolerate liquids or food, cough, altered mental status or any concerns. No signs of systemic illness or infection. The patient is nontoxic-appearing on exam and vital signs are within normal limits.   I have reviewed the triage vital signs and the nursing notes. Pertinent labs &imaging results that were available during my care of the patient were reviewed by me and considered in my medical decision making (see chart for details).  After history, exam, and medical workup I feel the patient has been appropriately medically screened and is safe for discharge home. Pertinent diagnoses were discussed with the patient. Patient  was given return precautions.   Claud Gowan, MD 12/28/17 9604

## 2018-03-21 ENCOUNTER — Ambulatory Visit (INDEPENDENT_AMBULATORY_CARE_PROVIDER_SITE_OTHER): Payer: 59 | Admitting: Family Medicine

## 2018-03-21 VITALS — BP 110/60 | HR 75 | Temp 99.1°F | Ht 67.0 in | Wt 217.0 lb

## 2018-03-21 DIAGNOSIS — L732 Hidradenitis suppurativa: Secondary | ICD-10-CM

## 2018-03-21 DIAGNOSIS — Z0000 Encounter for general adult medical examination without abnormal findings: Secondary | ICD-10-CM

## 2018-03-21 MED ORDER — TRIAMCINOLONE ACETONIDE 0.1 % EX CREA: 1.0000 "application " | TOPICAL_CREAM | Freq: Two times a day (BID) | CUTANEOUS | 0 refills | Status: DC

## 2018-03-21 NOTE — Progress Notes (Signed)
HPI:  Using dictation device. Unfortunately this device frequently misinterprets words/phrases.  Here for CPE:  -Concerns and/or follow up today:   New concern of lumps in pubic region. Intermittent chronically but worse since pregnancy last year. Sometimes swell up and "drain." has IUD now with her gynecologist. Had pap last year with gynecologist. Some weight gain recently with not as much exercise and some dietary indiscretion.  -Diet: variety of foods, balance and well rounded, larger portion sizes -Exercise: no regular exercise -Taking folic acid, vitamin D or calcium: no -Diabetes and Dyslipidemia Screening: not fasting -Vaccines: see vaccine section EPIC -pap history: sees gyn, done last year -FDLMP: see nursing notes -sexual activity: yes, female partner, no new partners -wants STI testing (Hep C if born [birth year]-65): no -FH breast, colon or ovarian ca: see FH Last mammogram: n/a Last colon cancer screening: n/a Breast Ca Risk Assessment: see family history and pt history DEXA (>/= 81): n/a  -Alcohol, Tobacco, drug use: see social history  Review of Systems - no fevers, unintentional weight loss, vision loss, hearing loss, chest pain, sob, hemoptysis, melena, hematochezia, hematuria, genital discharge, changing or concerning skin lesions, bleeding, bruising, loc, thoughts of self harm or SI  Past Medical History:  Diagnosis Date  . Abnormal cervical Papanicolaou smear    as teenager, normal since per her report  . Complication of anesthesia    spinal headache  . Gestational diabetes    with first pregnancy     Past Surgical History:  Procedure Laterality Date  . CESAREAN SECTION    . CESAREAN SECTION    . CESAREAN SECTION N/A 06/29/2017   Procedure: REPEAT CESAREAN SECTION;  Surgeon: Osborn Coho, MD;  Location: [facility];  Service: Obstetrics;  Laterality: N/A;  Tracey RNFA    Family History  Problem Relation Age of Onset  . Mental illness Brother   .  Irritable bowel syndrome Mother     Social History   Socioeconomic History  . Marital status: Married    Spouse name: Not on file  . Number of children: Not on file  . Years of education: Not on file  . Highest education level: Not on file  Occupational History  . Not on file  Social Needs  . Financial resource strain: Not on file  . Food insecurity:    Worry: Not on file    Inability: Not on file  . Transportation needs:    Medical: Not on file    Non-medical: Not on file  Tobacco Use  . Smoking status: Never Smoker  . Smokeless tobacco: Never Used  Substance and Sexual Activity  . Alcohol use: No    Comment: 2 glasees of wine occ  . Drug use: No  . Sexual activity: Not on file  Lifestyle  . Physical activity:    Days per week: Not on file    Minutes per session: Not on file  . Stress: Not on file  Relationships  . Social connections:    Talks on phone: Not on file    Gets together: Not on file    Attends religious service: Not on file    Active member of club or organization: Not on file    Attends meetings of clubs or organizations: Not on file    Relationship status: Not on file  Other Topics Concern  . Not on file  Social History Narrative   ** Merged History Encounter **       Work or School: Airline pilot -  registration      Home Situation: lives husband and 2 children      Spiritual Beliefs: Christian      Lifestyle: no regular exercise; diet is good     Current Outpatient Medications:  .  ibuprofen (ADVIL,MOTRIN) 600 MG tablet, Take 1 tablet (600 mg total) by mouth every 6 (six) hours as needed., Disp: 30 tablet, Rfl: 2 .  Multiple Vitamin (MULTIVITAMIN WITH MINERALS) TABS tablet, Take 1 tablet by mouth daily., Disp: , Rfl:  .  oxyCODONE (OXY IR/ROXICODONE) 5 MG immediate release tablet, Take 1 tablet (5 mg total) by mouth every 4 (four) hours as needed (pain scale 4-7)., Disp: 20 tablet, Rfl: 0 .  labetalol (NORMODYNE) 100 MG tablet, Take 1  tablet (100 mg total) by mouth 2 (two) times daily. (Patient not taking: Reported on 03/21/2018), Disp: 60 tablet, Rfl: 1 .  metoCLOPramide (REGLAN) 10 MG tablet, Take 1 tablet (10 mg total) by mouth 3 (three) times daily with meals., Disp: 42 tablet, Rfl: 0 .  triamcinolone cream (KENALOG) 0.1 %, Apply 1 application topically 2 (two) times daily., Disp: 30 g, Rfl: 0  EXAM:  Vitals:   03/21/18 1451  BP: 110/60  Pulse: 75  Temp: 99.1 F (37.3 C)    GENERAL: vitals reviewed and listed below, alert, oriented, appears well hydrated and in no acute distress  HEENT: head atraumatic, PERRLA, normal appearance of eyes, ears, nose and mouth. moist mucus membranes.  NECK: supple, no masses or lymphadenopathy  LUNGS: clear to auscultation bilaterally, no rales, rhonchi or wheeze  CV: HRRR, no peripheral edema or cyanosis, normal pedal pulses  ABDOMEN: bowel sounds normal, soft, non tender to palpation, no masses, no rebound or guarding  GU/BREAST: normal breast exam  SKIN: small papule R pubic region - healing, several hyperpig scars in this area and axillary region  MS: normal gait, moves all extremities normally  NEURO: normal gait, speech and thought processing grossly intact, muscle tone grossly intact throughout  PSYCH: normal affect, pleasant and cooperative  ASSESSMENT AND PLAN:  Discussed the following assessment and plan:  PREVENTIVE EXAM: -Discussed and advised all [place] preventive services health task force level A and B recommendations for age, sex and risks. -Advised at least 150 minutes of exercise per week and a healthy diet with avoidance of (less then 1 serving per week) processed foods, white starches, red meat, fast foods and sweets and consisting of: * 5-9 servings of fresh fruits and vegetables (not corn or potatoes) *nuts and seeds, beans *olives and olive oil *lean meats such as fish and white chicken  *whole grains -labs, studies and vaccines per orders this  encounter - Hemoglobin A1c - HDL cholesterol - Cholesterol, total  2. Hydradenitis -she wanted to address this new concern today and do physical, knows there could be additional charge if her insurance doesn't cover -mild, healing currently  -discussed tx options - she is breast feeding and is allergic to tetracyclines and clinda -she may consider seeing a dermatologist for other options, in interim will try short term topical steroid if needed   Patient advised to return to clinic immediately if symptoms worsen or persist or new concerns.  Patient Instructions  BEFORE YOU LEAVE: -labs -follow up: 3 months  See your gynecologist for pelvic and paps  Try the cream 1- 2 times daily for 1-2 weeks for for any outbreaks  We have ordered labs or studies at this visit. It can take up to 1-2 weeks for results  and processing. IF results require follow up or explanation, we will call you with instructions. Clinically stable results will be released to your [facility]. If you have not heard from us or cannot find your results in [facility] in 2 weeks please contact our office at [phone].  If you are not yet signed up for [facility], please consider signing up.   We recommend the following healthy lifestyle for LIFE: 1) Small portions. But, make sure to get regular (at least 3 per day), healthy meals and small healthy snacks if needed.  2) Eat a healthy clean diet.   TRY TO EAT: -at least 5-7 servings of low sugar, colorful, and nutrient rich vegetables per day (not corn, potatoes or bananas.) -berries are the best choice if you wish to eat fruit (only eat small amounts if trying to reduce weight)  -lean meets (fish, white meat of chicken or [place]) -vegan proteins for some meals - beans or tofu, whole grains, nuts and seeds -Replace bad fats with good fats - good fats include: fish, nuts and seeds, canola oil, olive oil -small amounts of low fat or non fat dairy -small amounts of100 % whole  grains - check the lables -drink plenty of water  AVOID: -SUGAR, sweets, anything with added sugar, corn syrup or sweeteners - must read labels as even foods advertised as "healthy" often are loaded with sugar -if you must have a sweetener, small amounts of stevia may be best -sweetened beverages and artificially sweetened beverages -simple starches (rice, bread, potatoes, pasta, chips, etc - small amounts of 100% whole grains are ok) -red meat, pork, butter -fried foods, fast food, processed food, excessive dairy, eggs and coconut.  3)Get at least 150 minutes of sweaty aerobic exercise per week.  4)Reduce stress - consider counseling, meditation and relaxation to balance other aspects of your life.          No follow-ups on file.  Terressa KoyanagiHannah R Delphin Funes, DO

## 2018-03-21 NOTE — Patient Instructions (Signed)
BEFORE YOU LEAVE: -labs -follow up: 3 months  See your gynecologist for pelvic and paps  Try the cream 1- 2 times daily for 1-2 weeks for for any outbreaks  We have ordered labs or studies at this visit. It can take up to 1-2 weeks for results and processing. IF results require follow up or explanation, we will call you with instructions. Clinically stable results will be released to your [facility]. If you have not heard from us or cannot find your results in [facility] in 2 weeks please contact our office at [phone].  If you are not yet signed up for [facility], please consider signing up.   We recommend the following healthy lifestyle for LIFE: 1) Small portions. But, make sure to get regular (at least 3 per day), healthy meals and small healthy snacks if needed.  2) Eat a healthy clean diet.   TRY TO EAT: -at least 5-7 servings of low sugar, colorful, and nutrient rich vegetables per day (not corn, potatoes or bananas.) -berries are the best choice if you wish to eat fruit (only eat small amounts if trying to reduce weight)  -lean meets (fish, white meat of chicken or [place]) -vegan proteins for some meals - beans or tofu, whole grains, nuts and seeds -Replace bad fats with good fats - good fats include: fish, nuts and seeds, canola oil, olive oil -small amounts of low fat or non fat dairy -small amounts of100 % whole grains - check the lables -drink plenty of water  AVOID: -SUGAR, sweets, anything with added sugar, corn syrup or sweeteners - must read labels as even foods advertised as "healthy" often are loaded with sugar -if you must have a sweetener, small amounts of stevia may be best -sweetened beverages and artificially sweetened beverages -simple starches (rice, bread, potatoes, pasta, chips, etc - small amounts of 100% whole grains are ok) -red meat, pork, butter -fried foods, fast food, processed food, excessive dairy, eggs and coconut.  3)Get at least 150 minutes of  sweaty aerobic exercise per week.  4)Reduce stress - consider counseling, meditation and relaxation to balance other aspects of your life.

## 2018-03-22 LAB — HDL CHOLESTEROL: HDL: 46.7 mg/dL (ref >39.00)

## 2018-03-22 LAB — HEMOGLOBIN A1C: HEMOGLOBIN A1C: 5.1 % (ref 4.6–6.5)

## 2018-03-22 LAB — CHOLESTEROL, TOTAL: Cholesterol: 170 mg/dL (ref 0–200)

## 2018-05-07 IMAGING — CT CT HEAD W/O CM
3 series · 16 of 47 positions shown, 19 images · non-contrast
Comparison: None.

CLINICAL DATA: 31-year-old female with syncope and fall.

EXAM:
CT HEAD WITHOUT CONTRAST
TECHNIQUE: Contiguous axial images were obtained from the base of the skull
through the vertex without intravenous contrast.

[Series 2: head wo · axial · 0.47mm/px · z∈[+1420,+1550]mm · 10 of 32 slices shown, 13 images]
[im 3/32  brain]
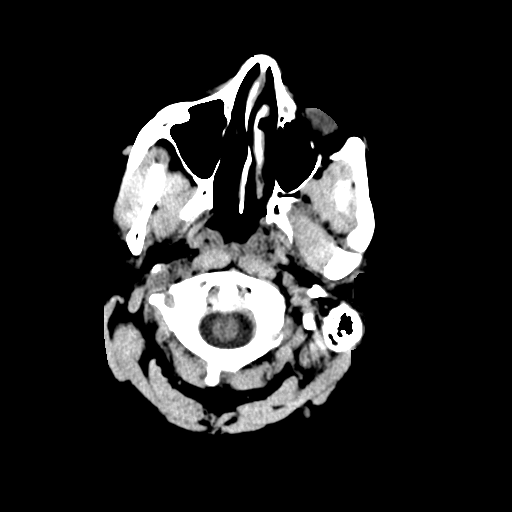
[im 3/32  bone]
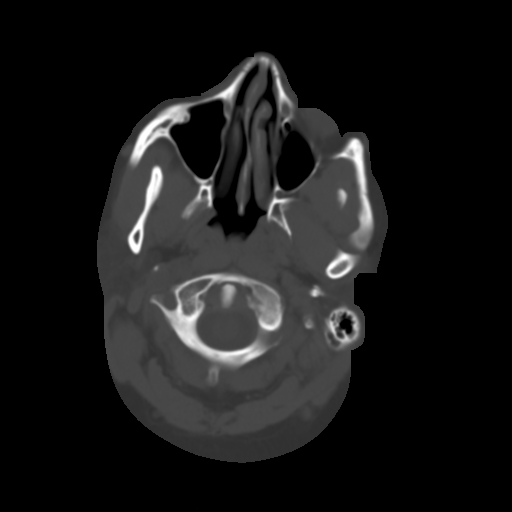
[im 6/32  brain]
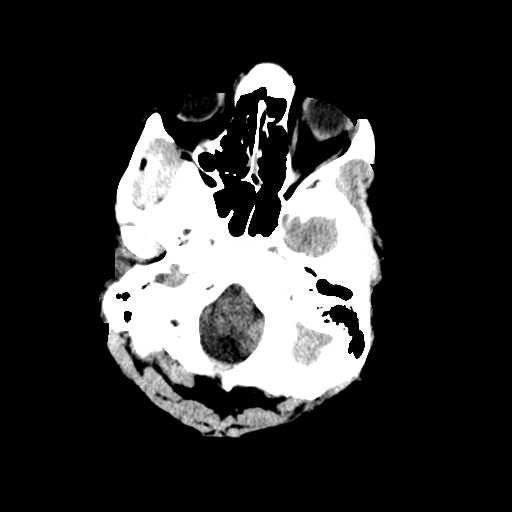
[im 9/32  brain]
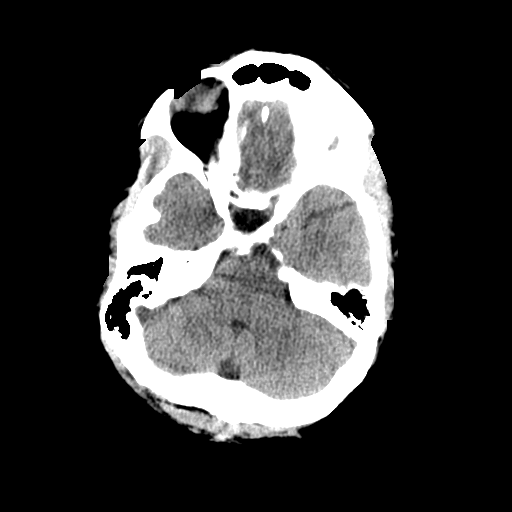
[im 11/32  brain]
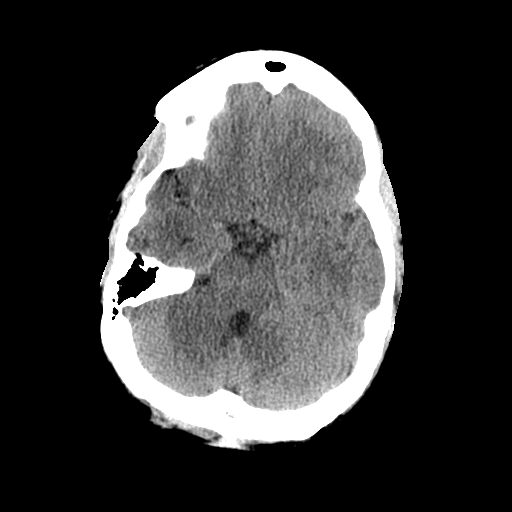
[im 14/32  brain]
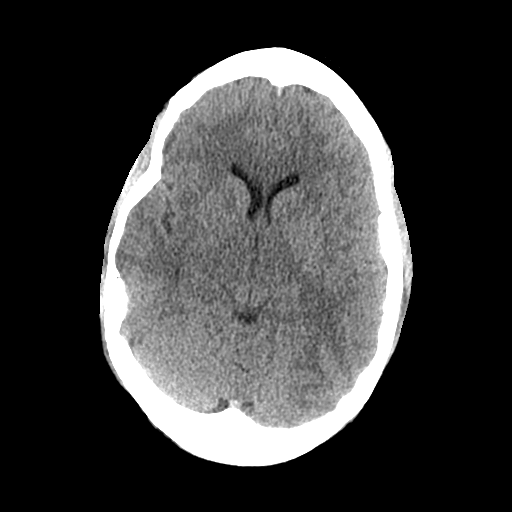
[im 14/32  bone]
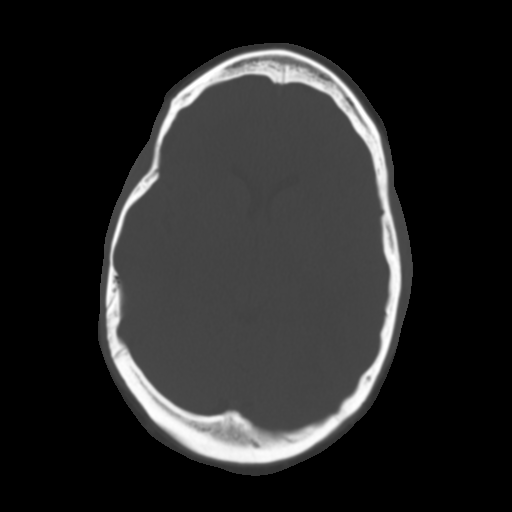
[im 18/32  brain]
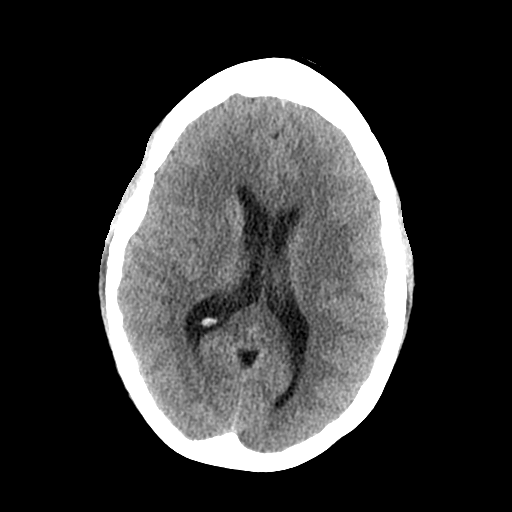
[im 21/32  brain]
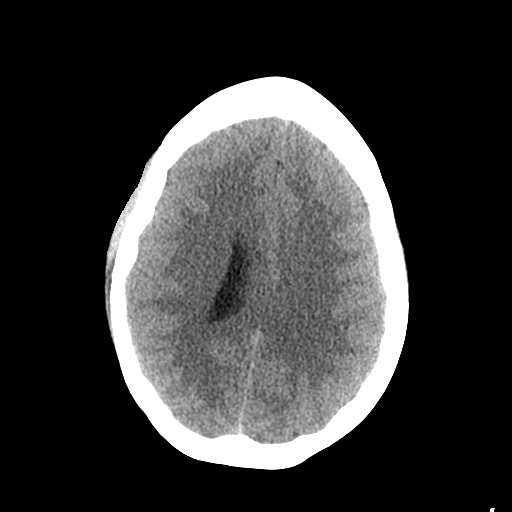
[im 24/32  brain]
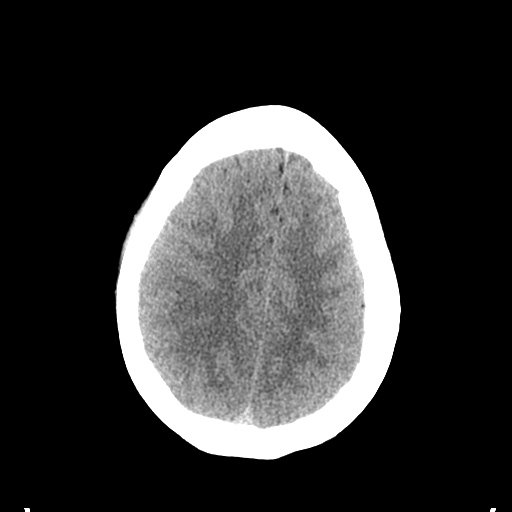
[im 26/32  brain]
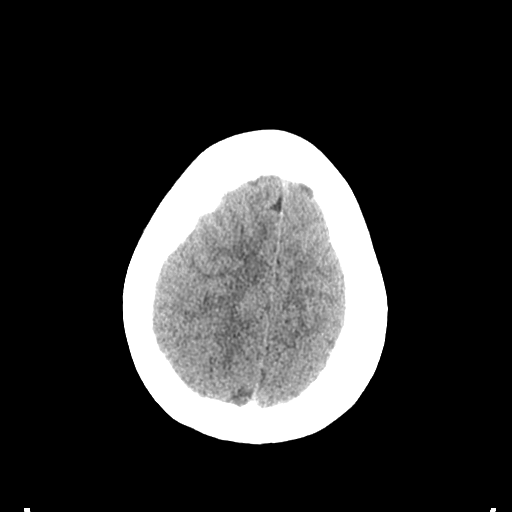
[im 26/32  bone]
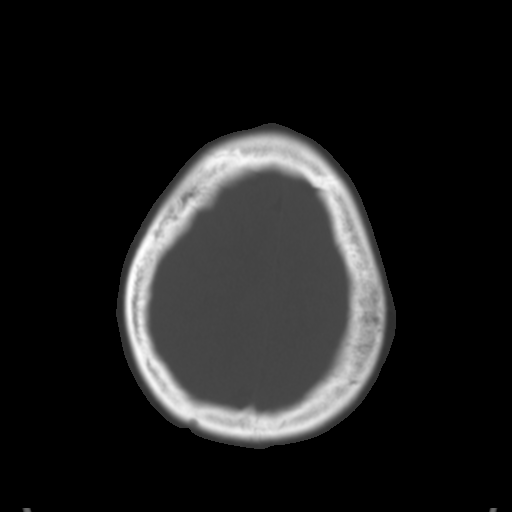
[im 29/32  brain]
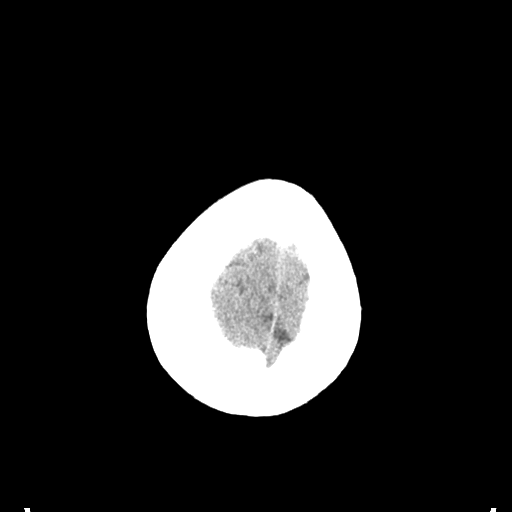

[Series 4: coronal soft tissue · coronal · 0.31mm/px · 3 of 66 slices shown]
[im 22/66  brain]
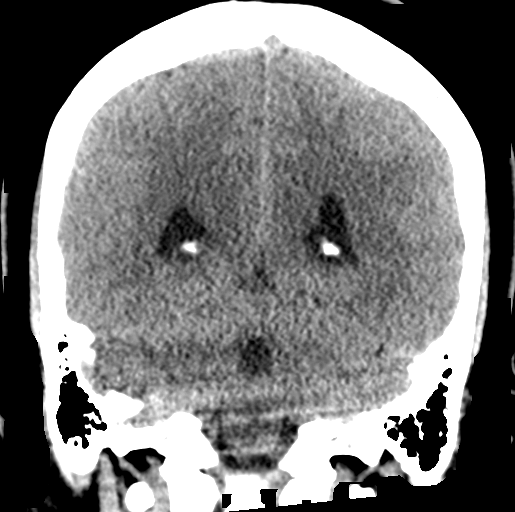
[im 29/66  brain]
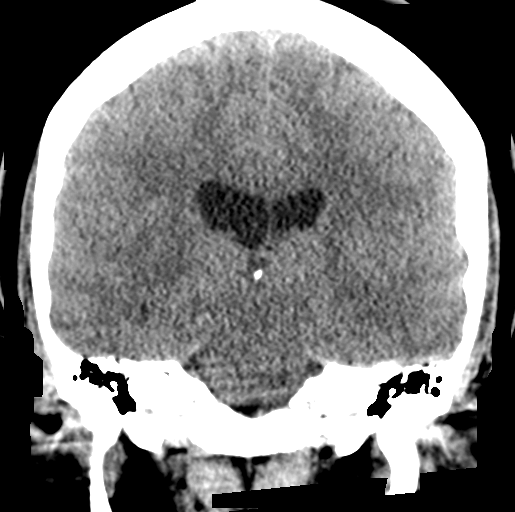
[im 37/66  brain]
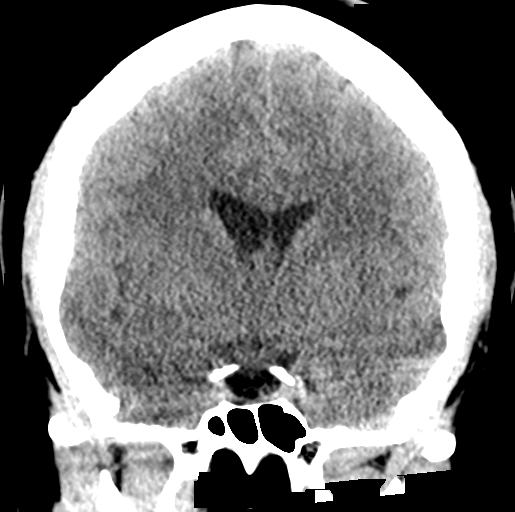

[Series 5: sagittal soft tissue · sagittal · 0.31mm/px · 3 of 50 slices shown]
[im 19/50  brain]
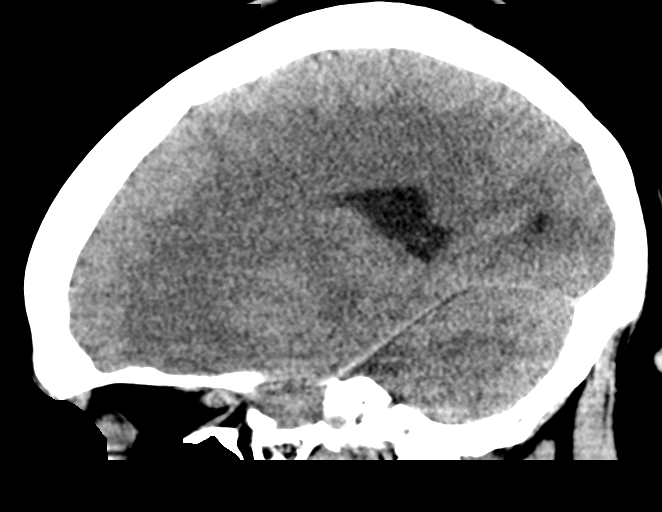
[im 25/50  brain]
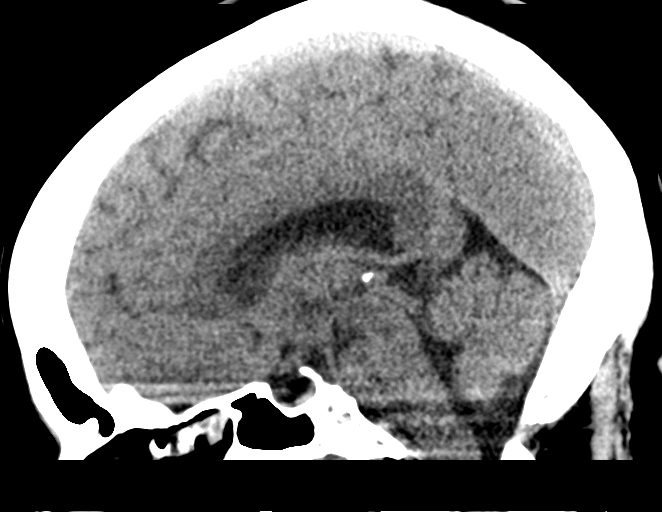
[im 32/50  brain]
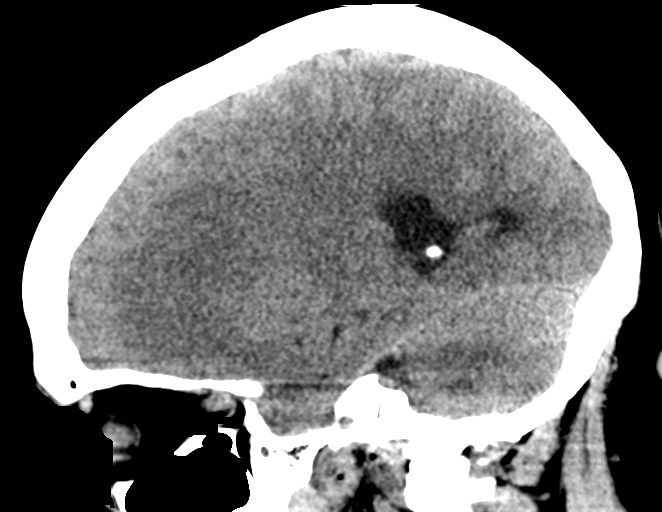

[16 of 47 positions shown; findings below may reference images not displayed]

FINDINGS: Brain: The ventricles and sulci appropriate size for patient's age.
Slight asymmetric prominence of the right lateral ventricle, likely
congenital. The gray-white matter discrimination is preserved. There
is no acute intracranial. No mass effect or midline shift. No
extra-axial fluid collection.

Vascular: No hyperdense vessel or unexpected calcification.

Skull: Normal. Negative for fracture or focal lesion.

Sinuses/Orbits: Mild mucoperiosteal thickening of paranasal sinuses.
No air-fluid level. The mastoid air cells are clear.

Other: None
IMPRESSION: Unremarkable noncontrast CT of the brain.

## 2018-05-07 IMAGING — CT CT ANGIO CHEST
2 of 6 series · 18 of 46 positions shown · IV contrast (ISOVUE 370)
Comparison: None.

CLINICAL DATA: 31-year-old female with syncope.

EXAM:
CT ANGIOGRAPHY CHEST WITH CONTRAST
TECHNIQUE: Multidetector CT imaging of the chest was performed using the
standard protocol during bolus administration of intravenous
contrast. Multiplanar CT image reconstructions and MIPs were
obtained to evaluate the vascular anatomy.
CONTRAST:  100mL HJDR08-PJH IOPAMIDOL (HJDR08-PJH) INJECTION 76%

[Series 7: thins · axial · 0.59mm/px · z∈[+908,+1101]mm · 15 of 213 slices shown]
[im 10/213  lung]
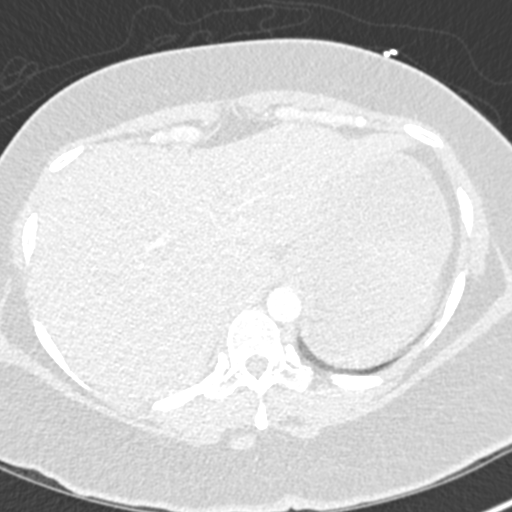
[im 28/213  soft-tissue]
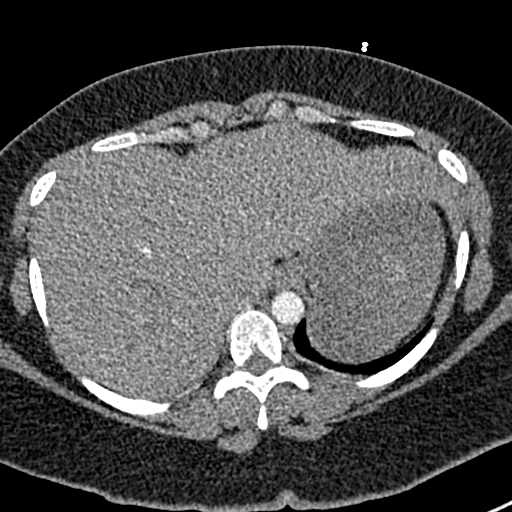
[im 37/213  lung]
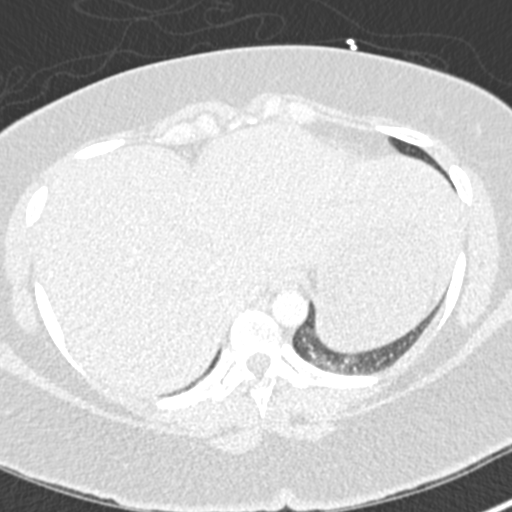
[im 56/213  soft-tissue]
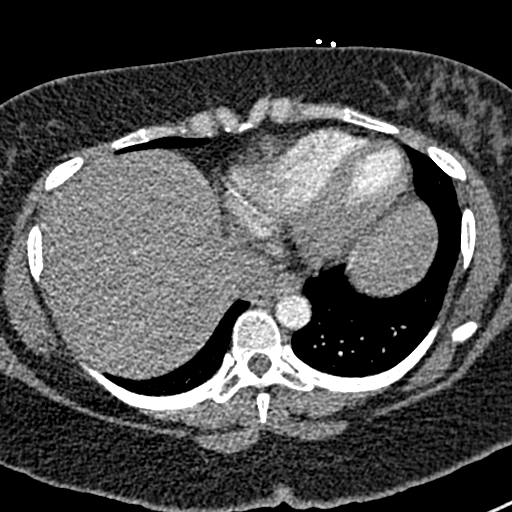
[im 65/213  lung]
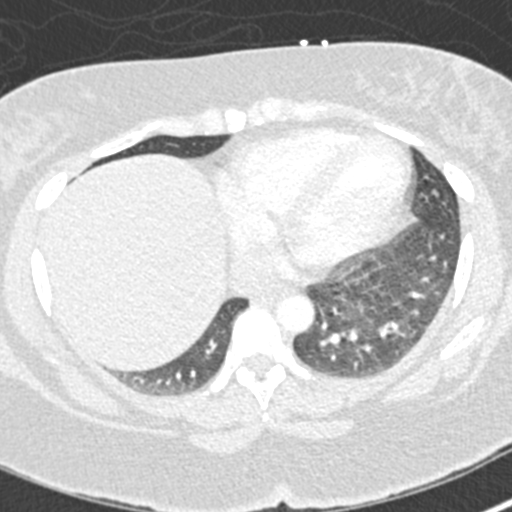
[im 83/213  soft-tissue]
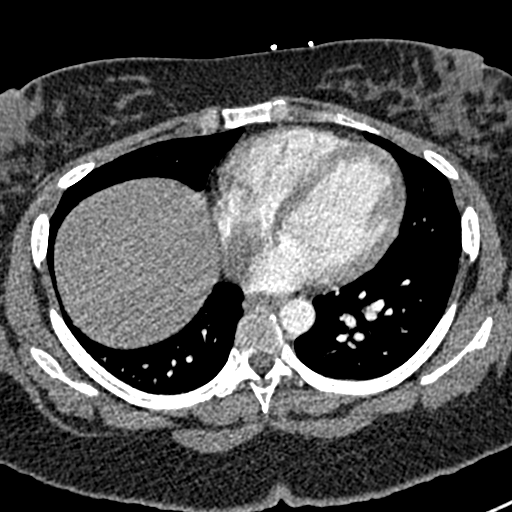
[im 93/213  lung]
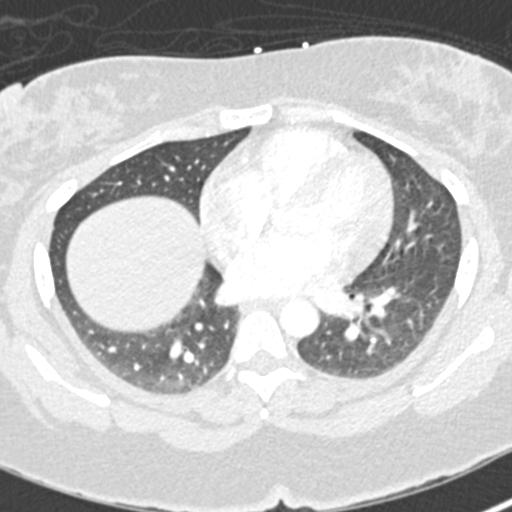
[im 111/213  soft-tissue]
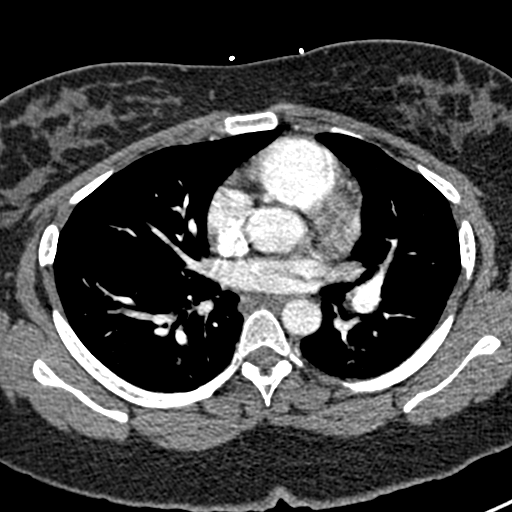
[im 120/213  lung]
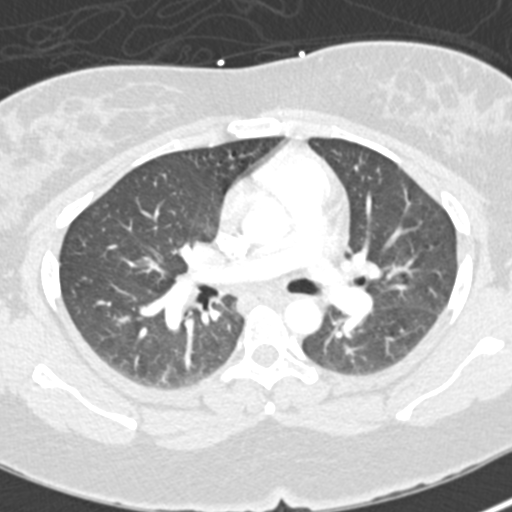
[im 130/213  soft-tissue]
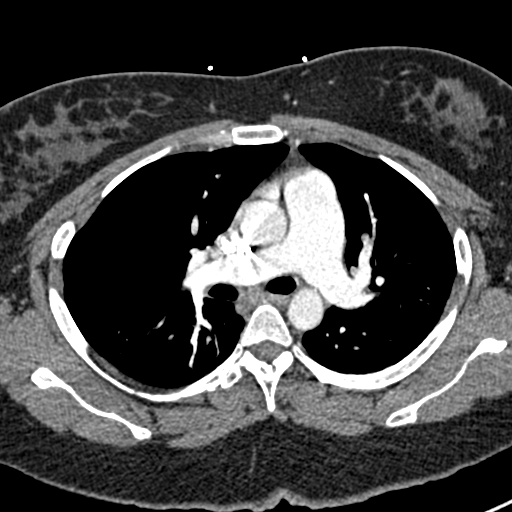
[im 148/213  lung]
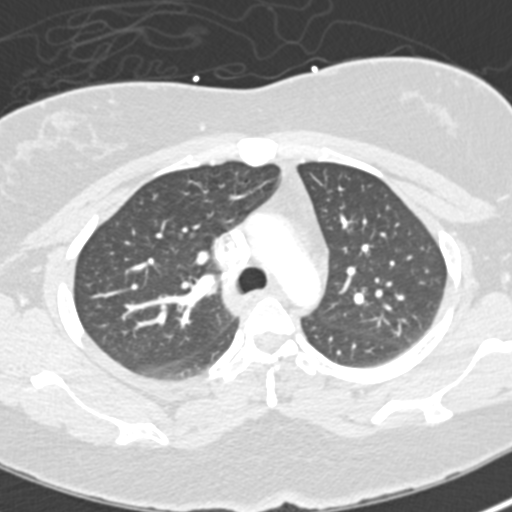
[im 157/213  soft-tissue]
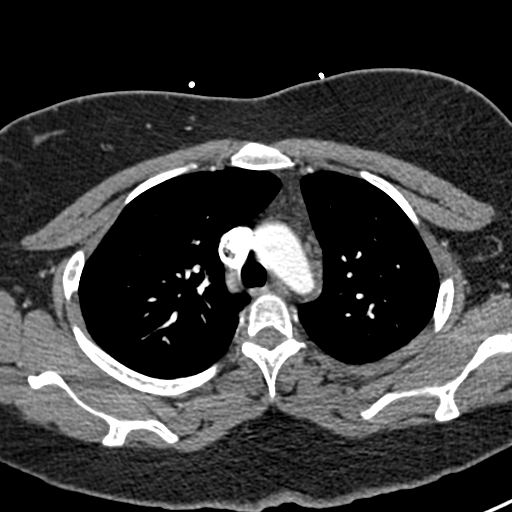
[im 176/213  lung]
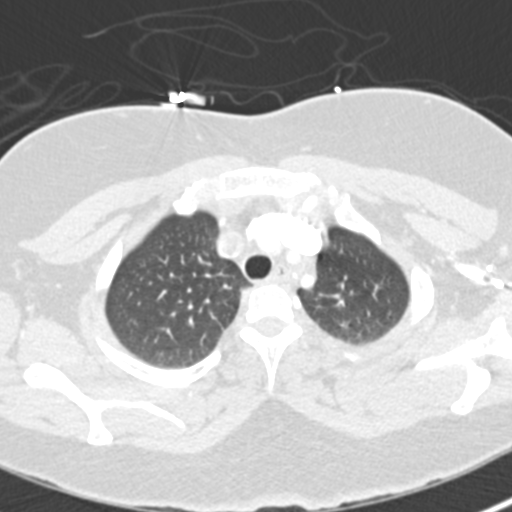
[im 185/213  soft-tissue]
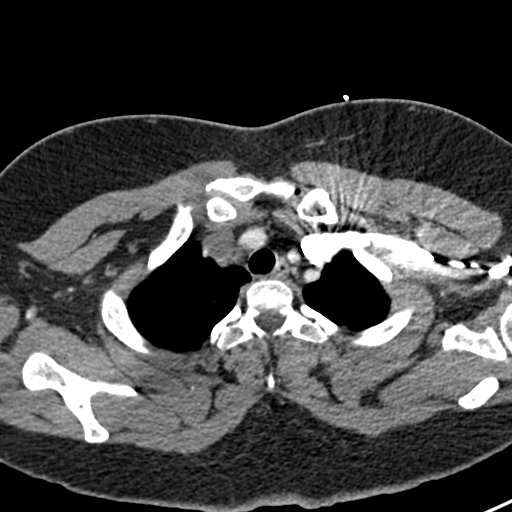
[im 203/213  lung]
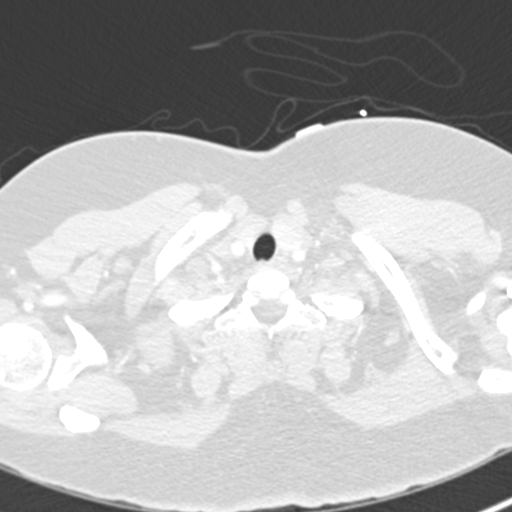

[Series 9: coronal mpr · coronal · 0.44mm/px · 3 of 117 slices shown]
[im 30/117  soft-tissue]
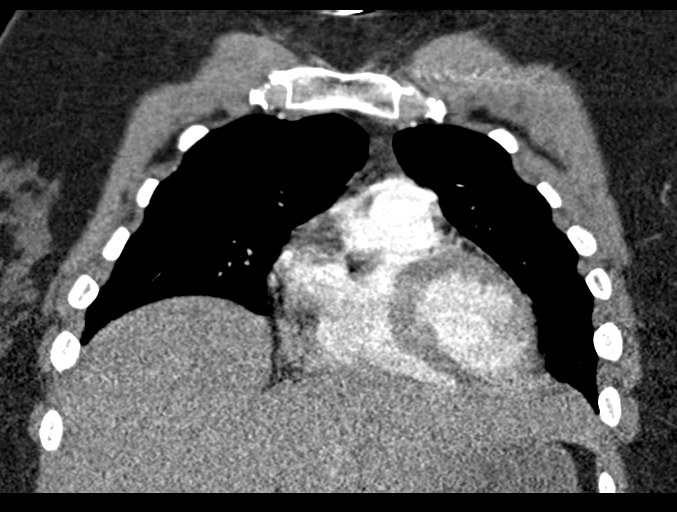
[im 59/117  soft-tissue]
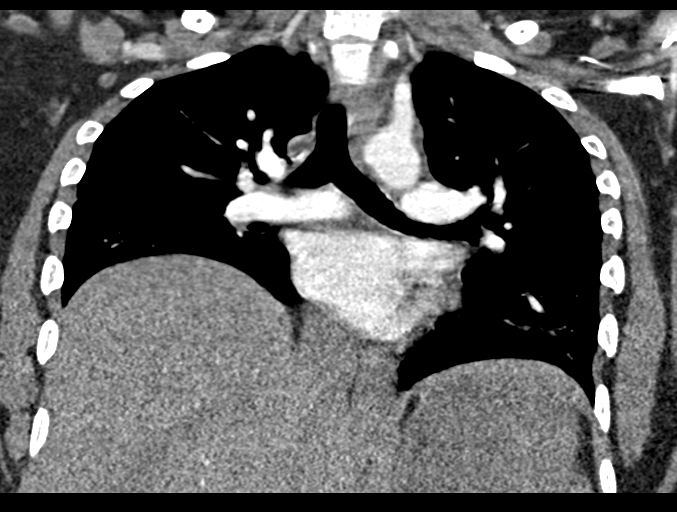
[im 88/117  soft-tissue]
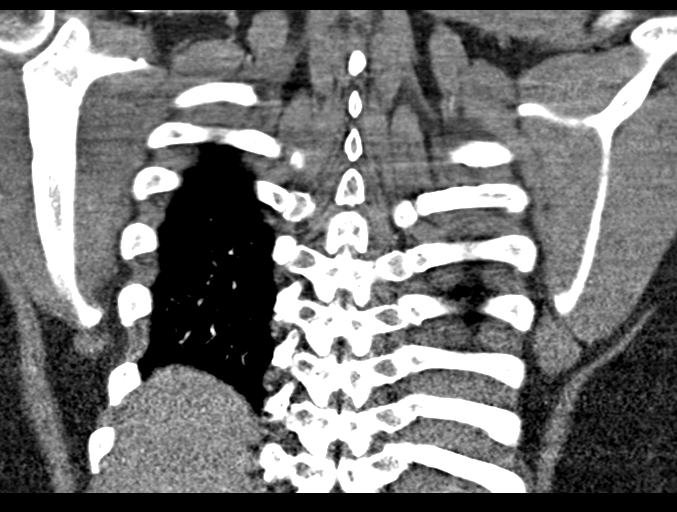

[18 of 46 positions shown; findings below may reference images not displayed]

FINDINGS: Cardiovascular: There is no cardiomegaly or pericardial effusion.
The thoracic aorta is unremarkable. Evaluation of the pulmonary
arteries is somewhat limited due to suboptimal opacification and
timing of the contrast as well as due to respiratory motion
artifact. No definite pulmonary artery embolus identified.

Mediastinum/Nodes: There is no hilar or mediastinal adenopathy.
Esophagus is grossly unremarkable. No mediastinal fluid collection.

Lungs/Pleura: The lungs are clear. There is no pleural effusion or
pneumothorax. The central airways are patent

Upper Abdomen: No acute abnormality.

Musculoskeletal: None

Review of the MIP images confirms the above findings.
IMPRESSION: No acute intrathoracic pathology. No large or central pulmonary
artery embolus.

## 2018-06-01 ENCOUNTER — Other Ambulatory Visit (HOSPITAL_COMMUNITY)
Admission: RE | Admit: 2018-06-01 | Discharge: 2018-06-01 | Disposition: A | Discharge status: Home / Self Care | Payer: 59 | Source: Ambulatory Visit | Attending: Family Medicine | Admitting: Family Medicine

## 2018-06-01 ENCOUNTER — Ambulatory Visit (INDEPENDENT_AMBULATORY_CARE_PROVIDER_SITE_OTHER): Payer: 59 | Admitting: Family Medicine

## 2018-06-01 ENCOUNTER — Encounter: Payer: Self-pay | Admitting: Family Medicine

## 2018-06-01 VITALS — BP 100/70 | HR 78 | Temp 98.9°F | Ht 67.0 in | Wt 215.5 lb

## 2018-06-01 DIAGNOSIS — Z113 Encounter for screening for infections with a predominantly sexual mode of transmission: Secondary | ICD-10-CM

## 2018-06-01 NOTE — Progress Notes (Signed)
  HPI:  Using dictation device. Unfortunately this device frequently misinterprets words/phrases.  Acute visit for STI screening: -? Infidelity with partner - occurred > 1 year ago -asymptomatic, no discharge, pain, ulcers, fevers, etc -wants testing for GC/chlam, HIV, Syphilis, herpes  ROS: See pertinent positives and negatives per HPI.  Past Medical History:  Diagnosis Date  . Abnormal cervical Papanicolaou smear    as teenager, normal since per her report  . Complication of anesthesia    spinal headache  . Gestational diabetes    with first pregnancy     Past Surgical History:  Procedure Laterality Date  . CESAREAN SECTION    . CESAREAN SECTION    . CESAREAN SECTION N/A 06/29/2017   Procedure: REPEAT CESAREAN SECTION;  Surgeon: Osborn Cohooberts, Angela, MD;  [facility];  Service: Obstetrics;  Laterality: N/A;  Tracey RNFA    Family History  Problem Relation Age of Onset  . Mental illness Brother   . Irritable bowel syndrome Mother     SOCIAL HX: see hpi   Current Outpatient Medications:  Marland Kitchen.  Multiple Vitamin (MULTIVITAMIN WITH MINERALS) TABS tablet, Take 1 tablet by mouth daily., Disp: , Rfl:   EXAM:  Vitals:   06/01/18 1648  BP: 100/70  Pulse: 78  Temp: 98.9 F (37.2 C)    Body mass index is 33.75 kg/m.  GENERAL: vitals reviewed and listed above, alert, oriented, appears well hydrated and in no acute distress  HEENT: atraumatic, conjunttiva clear, no obvious abnormalities on inspection of external nose and ears  NECK: no obvious masses on inspection  GU: normal appearance of external genitalia - no lesions or masses appreciated, normal appearing vaginal mucosa - no abnormal discharge, normal appearance of cervix - no lesions or abnormal discharge observe  MS: moves all extremities without noticeable abnormality  PSYCH: pleasant and cooperative, no obvious depression or anxiety  ASSESSMENT AND PLAN:  Discussed the following assessment and  plan:  Routine screening for STI (sexually transmitted infection) - Plan: HIV antibody, RPR, HSV(herpes simplex vrs) 1+2 ab-IgG, Cervicovaginal ancillary only  -labs per orders -discussed counseling for helping with stress related to this -advised to call if does not receive test results in 1-2 weeks -Patient advised to return or notify a doctor immediately if symptoms worsen or persist or new concerns arise.  There are no Patient Instructions on file for this visit.  Terressa KoyanagiHannah R Pranav Lince, DO

## 2018-06-02 LAB — HSV(HERPES SIMPLEX VRS) I + II AB-IGG
HAV 1 IGG,TYPE SPECIFIC AB: 51.2 index — ABNORMAL HIGH
HSV 2 IGG,TYPE SPECIFIC AB: 1.32 index — ABNORMAL HIGH

## 2018-06-02 LAB — HIV ANTIBODY (ROUTINE TESTING W REFLEX): HIV 1&2 Ab, 4th Generation: NONREACTIVE

## 2018-06-02 LAB — RPR: RPR Ser Ql: NONREACTIVE

## 2018-06-05 LAB — CERVICOVAGINAL ANCILLARY ONLY
Bacterial vaginitis: POSITIVE — AB
CANDIDA VAGINITIS: POSITIVE — AB
CHLAMYDIA, DNA PROBE: NEGATIVE
Neisseria Gonorrhea: NEGATIVE
Trichomonas: NEGATIVE

## 2018-06-06 MED ORDER — METRONIDAZOLE 0.75 % VA GEL: 1.0000 | Freq: Two times a day (BID) | VAGINAL | 0 refills | Status: AC

## 2018-06-06 MED ORDER — FLUCONAZOLE 150 MG PO TABS: 150.0000 mg | ORAL_TABLET | Freq: Once | ORAL | 0 refills | Status: AC

## 2018-06-06 MED FILL — metroNIDAZOLE 0.75 % GEL: 0.75 | 5 days supply | Qty: 70 | Fill #0

## 2018-06-06 MED FILL — FLUCONAZOLE 150 MG TABS: 150 | 1 days supply | Qty: 1 | Fill #0

## 2018-06-06 NOTE — Addendum Note (Signed)
Addended by: Johnella MoloneyFUNDERBURK, JO A on: 06/06/2018 09:29 AM   Modules accepted: Orders

## 2018-06-11 NOTE — Progress Notes (Signed)
  HPI:  Using dictation device. Unfortunately this device frequently misinterprets words/phrases.  Acute visit for new dx Herpes: -denies any hx of lesions, outbreaks or illness -she requested hsv testing as part of STI testing for concerns of infidelity with her spouse, he is present today -she does know several individuals whom had cold sores with confirmed herpes. One stayed at her house once and one at work. She did not share drinks with these individuals. -spouse denies any hx of herpes or lesions and he plans to get tested today  She also wants to discus the BV: -no sig discharge, odor or symptoms -but she did opt for treatment with PV metro and tolerated well  ROS: See pertinent positives and negatives per HPI.  Past Medical History:  Diagnosis Date  . Abnormal cervical Papanicolaou smear    as teenager, normal since per her report  . Complication of anesthesia    spinal headache  . Gestational diabetes    with first pregnancy     Past Surgical History:  Procedure Laterality Date  . CESAREAN SECTION    . CESAREAN SECTION    . CESAREAN SECTION N/A 06/29/2017   Procedure: REPEAT CESAREAN SECTION;  Surgeon: Osborn Cohooberts, Angela, MD;  [facility];  Service: Obstetrics;  Laterality: N/A;  Tracey RNFA    Family History  Problem Relation Age of Onset  . Mental illness Brother   . Irritable bowel syndrome Mother     SOCIAL HX: see hpi   Current Outpatient Medications:  Marland Kitchen.  Multiple Vitamin (MULTIVITAMIN WITH MINERALS) TABS tablet, Take 1 tablet by mouth daily., Disp: , Rfl:   EXAM:  Vitals:   06/12/18 1121  BP: 102/80  Pulse: 71  Temp: 98.5 F (36.9 C)    Body mass index is 34.11 kg/m.  GENERAL: vitals reviewed and listed above, alert, oriented, appears well hydrated and in no acute distress  HEENT: atraumatic, conjunttiva clear, no obvious abnormalities on inspection of external nose and ears  NECK: no obvious masses on inspection  MS: moves  all extremities without noticeable abnormality  PSYCH: pleasant and cooperative, no obvious depression or anxiety  ASSESSMENT AND PLAN:  Discussed the following assessment and plan: More than 50% of over 25 minutes spent in total in caring for this patient was spent face-to-face with the patient, counseling and/or coordinating care.    Herpes - had a lengthy discussion with her and her partner regarding transmission, symptoms, implications, treatment options -she decided against any treatment for now as has not had any symptoms and spouse is getting tested and they are having marital issues right now so she is not sure if she wants to take suppressive therapy, understand risks in pregnancy and risks of transmission  Bacterial vaginitis -discussed symptoms, treatments, triggers of increased occurence  -Patient advised to return or notify a doctor immediately if symptoms worsen or persist or new concerns arise.  There are no Patient Instructions on file for this visit.  Julia KoyanagiHannah R Kim, DO

## 2018-06-12 ENCOUNTER — Ambulatory Visit (INDEPENDENT_AMBULATORY_CARE_PROVIDER_SITE_OTHER): Payer: 59 | Admitting: Family Medicine

## 2018-06-12 ENCOUNTER — Encounter: Payer: Self-pay | Admitting: Family Medicine

## 2018-06-12 VITALS — BP 102/80 | HR 71 | Temp 98.5°F | Ht 67.0 in | Wt 217.8 lb

## 2018-06-12 DIAGNOSIS — B9689 Other specified bacterial agents as the cause of diseases classified elsewhere: Secondary | ICD-10-CM | POA: Diagnosis not present

## 2018-06-12 DIAGNOSIS — B009 Herpesviral infection, unspecified: Secondary | ICD-10-CM

## 2018-06-12 DIAGNOSIS — N760 Acute vaginitis: Secondary | ICD-10-CM

## 2018-09-06 ENCOUNTER — Encounter: Payer: Self-pay | Admitting: Family Medicine

## 2018-09-08 NOTE — Progress Notes (Signed)
  HPI:  Using dictation device. Unfortunately this device frequently misinterprets words/phrases.  Follow up + herpes serology. She insisted on this testing after a question of infidelity with spouse. She denies any hx of prior exposure or outbreaks genital or oral. We advised her against herpes testing, but she insisted and it came back positive. Julia Ruiz was tested and was negative, he also has never had symptoms and denies accusations of infidelity. Now they both are worried about transmission. Continues to remain symptom free. No hx of or current ulcers, pain, rashes, fevers, malaise.  ROS: See pertinent positives and negatives per HPI.  Past Medical History:  Diagnosis Date  . Abnormal cervical Papanicolaou smear    as teenager, normal since per her report  . Complication of anesthesia    spinal headache  . Gestational diabetes    with first pregnancy     Past Surgical History:  Procedure Laterality Date  . CESAREAN SECTION    . CESAREAN SECTION    . CESAREAN SECTION N/A 06/29/2017   Procedure: REPEAT CESAREAN SECTION;  Surgeon: Julia Cohooberts, Angela, MD;  [facility];  Service: Obstetrics;  Laterality: N/A;  Tracey RNFA    Family History  Problem Relation Age of Onset  . Mental illness Brother   . Irritable bowel syndrome Mother     SOCIAL HX: see hpi   Current Outpatient Medications:  Marland Kitchen.  Multiple Vitamin (MULTIVITAMIN WITH MINERALS) TABS tablet, Take 1 tablet by mouth daily., Disp: , Rfl:  .  OVER THE COUNTER MEDICATION, Super Lysine, Disp: , Rfl:  .  OVER THE COUNTER MEDICATION, Zinc, Disp: , Rfl:  .  valACYclovir (VALTREX) 500 MG tablet, Take 1 tablet (500 mg total) by mouth 2 (two) times daily. For 3 days at first signs outbreak., Disp: 6 tablet, Rfl: 3  EXAM:  Vitals:   09/11/18 1149  BP: 112/80  Pulse: 90  Temp: 98.3 F (36.8 C)    Body mass index is 32.11 kg/m.  GENERAL: vitals reviewed and listed above, alert, oriented, appears well  hydrated and in no acute distress  HEENT: atraumatic, conjunttiva clear, no obvious abnormalities on inspection of external nose and ears  NECK: no obvious masses on inspection  LUNGS: clear to auscultation bilaterally, no wheezes, rales or rhonchi, good air movement  CV: HRRR, no peripheral edema  MS: moves all extremities without noticeable abnormality  PSYCH: pleasant and cooperative, no obvious depression or anxiety  ASSESSMENT AND PLAN:  Discussed the following assessment and plan: More than 50% of over 25 minutes spent in total in caring for this patient was spent face-to-face with the patient, counseling and/or coordinating care.   Herpes  -discussed limitations, indications, risks false pos and negatives with various types of herpes testing in asymptomatic patients -discussed implications, transmission, symptoms of herpes virus infections -they opt for episodic treatment with valtrex in case of outbreak for now and condoms  -follow up as needed   Patient Instructions  I sent the medication to the pharmacy.     Julia KoyanagiHannah R Kaseem Vastine, DO

## 2018-09-11 ENCOUNTER — Ambulatory Visit (INDEPENDENT_AMBULATORY_CARE_PROVIDER_SITE_OTHER): Payer: 59 | Admitting: Family Medicine

## 2018-09-11 ENCOUNTER — Encounter: Payer: Self-pay | Admitting: Family Medicine

## 2018-09-11 VITALS — BP 112/80 | HR 90 | Temp 98.3°F | Ht 67.0 in | Wt 205.0 lb

## 2018-09-11 DIAGNOSIS — B009 Herpesviral infection, unspecified: Secondary | ICD-10-CM | POA: Diagnosis not present

## 2018-09-11 MED ORDER — VALACYCLOVIR HCL 500 MG PO TABS: 500.0000 mg | ORAL_TABLET | Freq: Two times a day (BID) | ORAL | 3 refills | Status: DC

## 2018-09-11 NOTE — Patient Instructions (Signed)
I sent the medication to the pharmacy.

## 2018-12-01 ENCOUNTER — Ambulatory Visit: Payer: Self-pay | Admitting: *Deleted

## 2018-12-01 MED ORDER — OSELTAMIVIR PHOSPHATE 75 MG PO CAPS: ORAL_CAPSULE | ORAL | 0 refills | Status: DC

## 2018-12-01 NOTE — Telephone Encounter (Signed)
Will send to Dr Caryl Never to advise as Dr Selena Batten is not in office.

## 2018-12-01 NOTE — Telephone Encounter (Addendum)
Rx has been sent in.  Called patient, unable to leave a voicemail.

## 2018-12-01 NOTE — Addendum Note (Signed)
Addended by: Solon Augusta on: 12/01/2018 04:31 PM   Modules accepted: Orders

## 2018-12-01 NOTE — Telephone Encounter (Signed)
If not having any symptoms, may send in Tamiflu 75 mg po qd for 10 days.

## 2018-12-01 NOTE — Telephone Encounter (Signed)
Spoke with patient. She is experiencing body aches, cough, runny nose, sneezing, and sore throat.  Please advise.

## 2018-12-01 NOTE — Telephone Encounter (Signed)
Contacted pt regarding her concerns; she states that all 3 of her children were diagnosed with the flu on 12/01/2018; she did receive a flu shot this year; recommendations made per nurse triage protocol; the pt verifies her pharmacy as [facility] [address]; pt last seen by 09/11/18 by Dr Kriste Basque, [facility], will route to office for final disposition.  Reason for Disposition . [1] Influenza EXPOSURE within last 48 hours (2 days) AND [2] NOT HIGH RISK AND [3] strongly requests antiviral medication  Answer Assessment - Initial Assessment Questions 1. TYPE of EXPOSURE: "How were you exposed?" (e.g., close contact, not a close contact)     3 children have been diagnosed with the flu 2. DATE of EXPOSURE: "When did the exposure occur?" (e.g., hour, days, weeks)     days 3. PREGNANCY: "Is there any chance you are pregnant?" "When was your last menstrual period?"     No LMP Nov 14, 2018 4. HIGH RISK for COMPLICATIONS: "Do you have any heart or lung problems? Do you have a weakened immune system?" (e.g., CHF, COPD, asthma, HIV positive, chemotherapy, renal failure, diabetes mellitus, sickle cell anemia)     no 5. SYMPTOMS: "Do you have any symptoms?" (e.g., cough, fever, sore throat, difficulty breathing).  body aches, productive cough with yellow secretions  Protocols used: INFLUENZA EXPOSURE-A-AH

## 2018-12-01 NOTE — Telephone Encounter (Signed)
If having symptoms take the Tamiflu 75 mg po bid for 5 days.

## 2019-08-10 ENCOUNTER — Ambulatory Visit (INDEPENDENT_AMBULATORY_CARE_PROVIDER_SITE_OTHER): Payer: 59

## 2019-08-10 ENCOUNTER — Other Ambulatory Visit: Payer: Self-pay

## 2019-08-10 DIAGNOSIS — Z23 Encounter for immunization: Secondary | ICD-10-CM

## 2019-08-22 DIAGNOSIS — Z139 Encounter for screening, unspecified: Secondary | ICD-10-CM | POA: Diagnosis not present

## 2019-08-22 DIAGNOSIS — Z124 Encounter for screening for malignant neoplasm of cervix: Secondary | ICD-10-CM | POA: Diagnosis not present

## 2019-08-22 DIAGNOSIS — Z113 Encounter for screening for infections with a predominantly sexual mode of transmission: Secondary | ICD-10-CM | POA: Diagnosis not present

## 2019-08-22 DIAGNOSIS — Z3040 Encounter for surveillance of contraceptives, unspecified: Secondary | ICD-10-CM | POA: Diagnosis not present

## 2019-08-22 DIAGNOSIS — Z01419 Encounter for gynecological examination (general) (routine) without abnormal findings: Secondary | ICD-10-CM | POA: Diagnosis not present

## 2019-08-22 LAB — HM PAP SMEAR

## 2019-09-11 ENCOUNTER — Encounter: Payer: Self-pay | Admitting: Family Medicine

## 2019-10-03 ENCOUNTER — Telehealth: Payer: Self-pay

## 2019-10-03 NOTE — Telephone Encounter (Signed)
Copied from [place] [id number]. Topic: Appointment Scheduling - Transfer of Care >> Oct 03, 2019  9:29 AM Oneta Rack wrote: Patient would like to transfer from Dr. Maudie Mercury to Dr. Volanda Napoleon preferable, even thou she scheduled a TOC with Dr. Jerilee Hoh for 11/02/2019. Please advise patient directly if Dr. Volanda Napoleon will approve TOC

## 2019-10-05 NOTE — Telephone Encounter (Signed)
Noted  

## 2019-10-05 NOTE — Telephone Encounter (Signed)
Called pt on both numbers to update but no answer. Dr.Banks is no longer accepting new pt. Crm created!

## 2019-10-05 NOTE — Telephone Encounter (Signed)
Unfortunately, I am no longer taking new pts.

## 2019-10-15 ENCOUNTER — Encounter: Payer: Self-pay | Admitting: Family Medicine

## 2019-11-02 ENCOUNTER — Other Ambulatory Visit: Payer: Self-pay

## 2019-11-02 ENCOUNTER — Ambulatory Visit: Payer: 59 | Admitting: Internal Medicine

## 2019-11-02 ENCOUNTER — Encounter: Payer: 59 | Admitting: Internal Medicine

## 2019-11-02 ENCOUNTER — Encounter: Payer: Self-pay | Admitting: Internal Medicine

## 2019-11-02 DIAGNOSIS — A6000 Herpesviral infection of urogenital system, unspecified: Secondary | ICD-10-CM

## 2019-11-02 DIAGNOSIS — E559 Vitamin D deficiency, unspecified: Secondary | ICD-10-CM

## 2019-11-02 DIAGNOSIS — E669 Obesity, unspecified: Secondary | ICD-10-CM

## 2019-11-02 DIAGNOSIS — J302 Other seasonal allergic rhinitis: Secondary | ICD-10-CM | POA: Diagnosis not present

## 2019-11-02 NOTE — Patient Instructions (Signed)
-  Nice seeing you today!!  -Schedule follow up for your CPE at your convenience. Please come in fasting that day.

## 2019-11-02 NOTE — Progress Notes (Signed)
Established Patient Office Visit     This visit occurred during the SARS-CoV-2 public health emergency.  Safety protocols were in place, including screening questions prior to the visit, additional usage of staff PPE, and extensive cleaning of exam room while observing appropriate contact time as indicated for disinfecting solutions.    CC/Reason for Visit: Establish care, discuss chronic conditions  HPI: Julia Ruiz is a 34 y.o. female who is coming in today for the above mentioned reasons. Past Medical History is significant for: Obesity, seasonal allergies, vitamin D deficiency and genital herpes without frequent outbreaks.  She works in the Pharmacist, community for [employer], she has 4 children ages 65, 70, 52, 2.  She is married.  She has allergies to tetracycline which cause hives, she does not smoke, she drinks alcohol occasionally.  Her past surgical history is significant for C-section x3.  Her family history significant for a father with hyperlipidemia and a brother with depression.  She does mention that she has cosmetic surgery planned for April and will need clearance prior to.   Past Medical/Surgical History: Past Medical History:  Diagnosis Date  . Abnormal cervical Papanicolaou smear    as teenager, normal since per her report  . Complication of anesthesia    spinal headache  . Genital herpes   . Gestational diabetes    with first pregnancy   . Obesity (BMI 30.0-34.9)   . Seasonal allergies   . Vitamin D deficiency     Past Surgical History:  Procedure Laterality Date  . CESAREAN SECTION    . CESAREAN SECTION    . CESAREAN SECTION N/A 06/29/2017   Procedure: REPEAT CESAREAN SECTION;  Surgeon: Everett Graff, MD;  Location: [facility];  Service: Obstetrics;  Laterality: N/A;  Tracey RNFA    Social History:  reports that she has never smoked. She has never used smokeless tobacco. She reports current alcohol use. She reports that she does not use  drugs.  Allergies: Allergies  Allergen Reactions  . Tetracyclines & Related Hives, Shortness Of Breath and Other (See Comments)    wheezing  . Clindagel [Clindamycin] Itching    Family History:  Family History  Problem Relation Age of Onset  . Mental illness Brother   . Irritable bowel syndrome Mother      Current Outpatient Medications:  .  Ergocalciferol (VITAMIN D2 PO), Take by mouth., Disp: , Rfl:  .  Multiple Vitamin (MULTIVITAMIN WITH MINERALS) TABS tablet, Take 1 tablet by mouth daily., Disp: , Rfl:  .  Multiple Vitamins-Minerals (CELEBRATE MULTI-COMPLETE 18 PO), Take by mouth., Disp: , Rfl:  .  UNABLE TO FIND, Med Name: Hema plex, Disp: , Rfl:   Review of Systems:  Constitutional: Denies fever, chills, diaphoresis, appetite change and fatigue.  HEENT: Denies photophobia, eye pain, redness, hearing loss, ear pain, congestion, sore throat, rhinorrhea, sneezing, mouth sores, trouble swallowing, neck pain, neck stiffness and tinnitus.   Respiratory: Denies SOB, DOE, cough, chest tightness,  and wheezing.   Cardiovascular: Denies chest pain, palpitations and leg swelling.  Gastrointestinal: Denies nausea, vomiting, abdominal pain, diarrhea, constipation, blood in stool and abdominal distention.  Genitourinary: Denies dysuria, urgency, frequency, hematuria, flank pain and difficulty urinating.  Endocrine: Denies: hot or cold intolerance, sweats, changes in hair or nails, polyuria, polydipsia. Musculoskeletal: Denies myalgias, back pain, joint swelling, arthralgias and gait problem.  Skin: Denies pallor, rash and wound.  Neurological: Denies dizziness, seizures, syncope, weakness, light-headedness, numbness and headaches.  Hematological: Denies adenopathy.  Easy bruising, personal or family bleeding history  Psychiatric/Behavioral: Denies suicidal ideation, mood changes, confusion, nervousness, sleep disturbance and agitation    Physical Exam: Vitals:   11/02/19 1043  BP:  110/76  Pulse: 75  Temp: (!) 97.5 F (36.4 C)  TempSrc: Temporal  SpO2: 97%  Weight: 218 lb 1.6 oz (98.9 kg)  Height: 5' 7.5" (1.715 m)    Body mass index is 33.66 kg/m.   Constitutional: NAD, calm, comfortable Eyes: PERRL, lids and conjunctivae normal ENMT: Mucous membranes are moist.  Respiratory: clear to auscultation bilaterally, no wheezing, no crackles. Normal respiratory effort. No accessory muscle use.  Cardiovascular: Regular rate and rhythm, no murmurs / rubs / gallops. No extremity edema. Neurologic: Grossly intact and nonfocal Psychiatric: Normal judgment and insight. Alert and oriented x 3. Normal mood.    Impression and Plan:  Genital herpes simplex, unspecified site -Noted, without frequent outbreaks.  Seasonal allergies -Will use antihistamines and Flonase, spring and sometimes fall  Obesity (BMI 30.0-34.9) -Discussed healthy lifestyle, including increased physical activity and better food choices to promote weight loss.  Vitamin D deficiency -Check levels when she returns for physical.    Patient Instructions  -Nice seeing you today!!  -Schedule follow up for your CPE at your convenience. Please come in fasting that day.     Chaya Jan, MD [facility]

## 2019-11-03 ENCOUNTER — Encounter: Payer: Self-pay | Admitting: Internal Medicine

## 2019-11-03 DIAGNOSIS — Z01812 Encounter for preprocedural laboratory examination: Secondary | ICD-10-CM

## 2019-11-06 ENCOUNTER — Telehealth: Payer: Self-pay | Admitting: *Deleted

## 2019-11-16 ENCOUNTER — Encounter: Payer: Self-pay | Admitting: Internal Medicine

## 2019-11-16 ENCOUNTER — Ambulatory Visit (INDEPENDENT_AMBULATORY_CARE_PROVIDER_SITE_OTHER): Payer: 59 | Admitting: Internal Medicine

## 2019-11-16 ENCOUNTER — Other Ambulatory Visit: Payer: Self-pay

## 2019-11-16 VITALS — BP 110/80 | HR 75 | Temp 97.3°F | Ht 67.0 in | Wt 214.5 lb

## 2019-11-16 DIAGNOSIS — E559 Vitamin D deficiency, unspecified: Secondary | ICD-10-CM

## 2019-11-16 DIAGNOSIS — Z0000 Encounter for general adult medical examination without abnormal findings: Secondary | ICD-10-CM

## 2019-11-16 DIAGNOSIS — E669 Obesity, unspecified: Secondary | ICD-10-CM | POA: Diagnosis not present

## 2019-11-16 NOTE — Progress Notes (Signed)
Established Patient Office Visit     This visit occurred during the SARS-CoV-2 public health emergency.  Safety protocols were in place, including screening questions prior to the visit, additional usage of staff PPE, and extensive cleaning of exam room while observing appropriate contact time as indicated for disinfecting solutions.    CC/Reason for Visit: Annual preventive exam  HPI: Julia Ruiz is a 34 y.o. female who is coming in today for the above mentioned reasons. Past Medical History is significant for: Obesity, history of vitamin D deficiency and genital herpes.  She has no acute complaints today.  She has routine annual dental care.  We have talked about increased exercise and mild to moderate weight loss.  She saw her gynecologist in November and had a normal Pap smear then.  She is having cosmetic surgery to her abdomen in April and needs labs done 30 days prior.  She also needs a medical clearance letter.   Past Medical/Surgical History: Past Medical History:  Diagnosis Date  . Abnormal cervical Papanicolaou smear    as teenager, normal since per her report  . Complication of anesthesia    spinal headache  . Genital herpes   . Gestational diabetes    with first pregnancy   . Obesity (BMI 30.0-34.9)   . Seasonal allergies   . Vitamin D deficiency     Past Surgical History:  Procedure Laterality Date  . CESAREAN SECTION    . CESAREAN SECTION    . CESAREAN SECTION N/A 06/29/2017   Procedure: REPEAT CESAREAN SECTION;  Surgeon: Everett Graff, MD;  Location: [facility];  Service: Obstetrics;  Laterality: N/A;  Tracey RNFA    Social History:  reports that she has never smoked. She has never used smokeless tobacco. She reports current alcohol use. She reports that she does not use drugs.  Allergies: Allergies  Allergen Reactions  . Tetracyclines & Related Hives, Shortness Of Breath and Other (See Comments)    wheezing  . Clindagel [Clindamycin]  Itching    Family History:  Family History  Problem Relation Age of Onset  . Mental illness Brother   . Irritable bowel syndrome Mother      Current Outpatient Medications:  .  Ergocalciferol (VITAMIN D2 PO), Take by mouth., Disp: , Rfl:  .  Multiple Vitamin (MULTIVITAMIN WITH MINERALS) TABS tablet, Take 1 tablet by mouth daily., Disp: , Rfl:  .  Multiple Vitamins-Minerals (CELEBRATE MULTI-COMPLETE 18 PO), Take by mouth., Disp: , Rfl:  .  UNABLE TO FIND, Med Name: Hema plex, Disp: , Rfl:   Review of Systems:  Constitutional: Denies fever, chills, diaphoresis, appetite change and fatigue.  HEENT: Denies photophobia, eye pain, redness, hearing loss, ear pain, congestion, sore throat, rhinorrhea, sneezing, mouth sores, trouble swallowing, neck pain, neck stiffness and tinnitus.   Respiratory: Denies SOB, DOE, cough, chest tightness,  and wheezing.   Cardiovascular: Denies chest pain, palpitations and leg swelling.  Gastrointestinal: Denies nausea, vomiting, abdominal pain, diarrhea, constipation, blood in stool and abdominal distention.  Genitourinary: Denies dysuria, urgency, frequency, hematuria, flank pain and difficulty urinating.  Endocrine: Denies: hot or cold intolerance, sweats, changes in hair or nails, polyuria, polydipsia. Musculoskeletal: Denies myalgias, back pain, joint swelling, arthralgias and gait problem.  Skin: Denies pallor, rash and wound.  Neurological: Denies dizziness, seizures, syncope, weakness, light-headedness, numbness and headaches.  Hematological: Denies adenopathy. Easy bruising, personal or family bleeding history  Psychiatric/Behavioral: Denies suicidal ideation, mood changes, confusion, nervousness, sleep disturbance and agitation  Physical Exam: Vitals:   11/16/19 0702  BP: 110/80  Pulse: 75  Temp: (!) 97.3 F (36.3 C)  TempSrc: Temporal  SpO2: 97%  Weight: 214 lb 8 oz (97.3 kg)  Height: '5\' 7"'$  (1.702 m)    Body mass index is 33.6  kg/m.   Constitutional: NAD, calm, comfortable Eyes: PERRL, lids and conjunctivae normal ENMT: Mucous membranes are moist.Tympanic membrane is pearly white, no erythema or bulging. Neck: normal, supple, no masses, no thyromegaly Respiratory: clear to auscultation bilaterally, no wheezing, no crackles. Normal respiratory effort. No accessory muscle use.  Cardiovascular: Regular rate and rhythm, no murmurs / rubs / gallops. No extremity edema. 2+ pedal pulses.  Abdomen: no tenderness, no masses palpated. No hepatosplenomegaly. Bowel sounds positive.  Musculoskeletal: no clubbing / cyanosis. No joint deformity upper and lower extremities. Good ROM, no contractures. Normal muscle tone.  Skin: no rashes, lesions, ulcers. No induration Neurologic: CN 2-12 grossly intact. Sensation intact, DTR normal. Strength 5/5 in all 4.  Psychiatric: Normal judgment and insight. Alert and oriented x 3. Normal mood.    Impression and Plan:  Encounter for preventive health examination -She has routine eye and dental care. -Immunizations are up-to-date and age-appropriate. -She will return 30 days prior to her projected surgical date for requested labs.  She also needs a Covid PCR and is aware that those were not done in the office, she has been given instructions on how to schedule an appointment. -Barring significant abnormality in lab work, she is medically clear to proceed to surgery at this time  Obesity (BMI 30.0-34.9) -Discussed healthy lifestyle, including increased physical activity and better food choices to promote weight loss.  Vitamin D deficiency  - Plan: VITAMIN D 25 Hydroxy (Vit-D Deficiency, Fractures)   Patient Instructions  -Nice seeing you today!!  -Schedule lab appointment for pre-op labs.  -See you back in 1 year or sooner as needed.   Preventive Care 3-72 Years Old, Female Preventive care refers to visits with your health care provider and lifestyle choices that can promote  health and wellness. This includes:  A yearly physical exam. This may also be called an annual well check.  Regular dental visits and eye exams.  Immunizations.  Screening for certain conditions.  Healthy lifestyle choices, such as eating a healthy diet, getting regular exercise, not using drugs or products that contain nicotine and tobacco, and limiting alcohol use. What can I expect for my preventive care visit? Physical exam Your health care provider will check your:  Height and weight. This may be used to calculate body mass index (BMI), which tells if you are at a healthy weight.  Heart rate and blood pressure.  Skin for abnormal spots. Counseling Your health care provider may ask you questions about your:  Alcohol, tobacco, and drug use.  Emotional well-being.  Home and relationship well-being.  Sexual activity.  Eating habits.  Work and work Statistician.  Method of birth control.  Menstrual cycle.  Pregnancy history. What immunizations do I need?  Influenza (flu) vaccine  This is recommended every year. Tetanus, diphtheria, and pertussis (Tdap) vaccine  You may need a Td booster every 10 years. Varicella (chickenpox) vaccine  You may need this if you have not been vaccinated. Human papillomavirus (HPV) vaccine  If recommended by your health care provider, you may need three doses over 6 months. Measles, mumps, and rubella (MMR) vaccine  You may need at least one dose of MMR. You may also need a second dose. Meningococcal  second dose. Meningococcal conjugate (MenACWY) vaccine  One dose is recommended if you are age 19-21 years and a first-year college student living in a residence hall, or if you have one of several medical conditions. You may also need additional booster doses. Pneumococcal conjugate (PCV13) vaccine  You may need this if you have certain conditions and were not previously vaccinated. Pneumococcal polysaccharide (PPSV23) vaccine  You may need  one or two doses if you smoke cigarettes or if you have certain conditions. Hepatitis A vaccine  You may need this if you have certain conditions or if you travel or work in places where you may be exposed to hepatitis A. Hepatitis B vaccine  You may need this if you have certain conditions or if you travel or work in places where you may be exposed to hepatitis B. Haemophilus influenzae type b (Hib) vaccine  You may need this if you have certain conditions. You may receive vaccines as individual doses or as more than one vaccine together in one shot (combination vaccines). Talk with your health care provider about the risks and benefits of combination vaccines. What tests do I need?  Blood tests  Lipid and cholesterol levels. These may be checked every 5 years starting at age 20.  Hepatitis C test.  Hepatitis B test. Screening  Diabetes screening. This is done by checking your blood sugar (glucose) after you have not eaten for a while (fasting).  Sexually transmitted disease (STD) testing.  BRCA-related cancer screening. This may be done if you have a family history of breast, ovarian, tubal, or peritoneal cancers.  Pelvic exam and Pap test. This may be done every 3 years starting at age 21. Starting at age 30, this may be done every 5 years if you have a Pap test in combination with an HPV test. Talk with your health care provider about your test results, treatment options, and if necessary, the need for more tests. Follow these instructions at home: Eating and drinking   Eat a diet that includes fresh fruits and vegetables, whole grains, lean protein, and low-fat dairy.  Take vitamin and mineral supplements as recommended by your health care provider.  Do not drink alcohol if: ? Your health care provider tells you not to drink. ? You are pregnant, may be pregnant, or are planning to become pregnant.  If you drink alcohol: ? Limit how much you have to 0-1 drink a  day. ? Be aware of how much alcohol is in your drink. In the [place] one drink equals one 12 oz bottle of beer (355 mL), one 5 oz glass of wine (148 mL), or one 1 oz glass of hard liquor (44 mL). Lifestyle  Take daily care of your teeth and gums.  Stay active. Exercise for at least 30 minutes on 5 or more days each week.  Do not use any products that contain nicotine or tobacco, such as cigarettes, e-cigarettes, and chewing tobacco. If you need help quitting, ask your health care provider.  If you are sexually active, practice safe sex. Use a condom or other form of birth control (contraception) in order to prevent pregnancy and STIs (sexually transmitted infections). If you plan to become pregnant, see your health care provider for a preconception visit. What's next?  Visit your health care provider once a year for a well check visit.  Ask your health care provider how often you should have your eyes and teeth checked.  Stay up to date on all vaccines. This   intended to replace advice given to you by your health care provider. Make sure you discuss any questions you have with your health care provider. Document Revised: 06/15/2018 Document Reviewed: 06/15/2018 Elsevier Patient Education  2020 [place], [place] [facility]

## 2019-11-16 NOTE — Patient Instructions (Signed)
-Nice seeing you today!!  -Schedule lab appointment for pre-op labs.  -See you back in 1 year or sooner as needed.   Preventive Care 98-35 Years Old, Female Preventive care refers to visits with your health care provider and lifestyle choices that can promote health and wellness. This includes:  A yearly physical exam. This may also be called an annual well check.  Regular dental visits and eye exams.  Immunizations.  Screening for certain conditions.  Healthy lifestyle choices, such as eating a healthy diet, getting regular exercise, not using drugs or products that contain nicotine and tobacco, and limiting alcohol use. What can I expect for my preventive care visit? Physical exam Your health care provider will check your:  Height and weight. This may be used to calculate body mass index (BMI), which tells if you are at a healthy weight.  Heart rate and blood pressure.  Skin for abnormal spots. Counseling Your health care provider may ask you questions about your:  Alcohol, tobacco, and drug use.  Emotional well-being.  Home and relationship well-being.  Sexual activity.  Eating habits.  Work and work Statistician.  Method of birth control.  Menstrual cycle.  Pregnancy history. What immunizations do I need?  Influenza (flu) vaccine  This is recommended every year. Tetanus, diphtheria, and pertussis (Tdap) vaccine  You may need a Td booster every 10 years. Varicella (chickenpox) vaccine  You may need this if you have not been vaccinated. Human papillomavirus (HPV) vaccine  If recommended by your health care provider, you may need three doses over 6 months. Measles, mumps, and rubella (MMR) vaccine  You may need at least one dose of MMR. You may also need a second dose. Meningococcal conjugate (MenACWY) vaccine  One dose is recommended if you are age 8-21 years and a first-year college student living in a residence hall, or if you have one of several  medical conditions. You may also need additional booster doses. Pneumococcal conjugate (PCV13) vaccine  You may need this if you have certain conditions and were not previously vaccinated. Pneumococcal polysaccharide (PPSV23) vaccine  You may need one or two doses if you smoke cigarettes or if you have certain conditions. Hepatitis A vaccine  You may need this if you have certain conditions or if you travel or work in places where you may be exposed to hepatitis A. Hepatitis B vaccine  You may need this if you have certain conditions or if you travel or work in places where you may be exposed to hepatitis B. Haemophilus influenzae type b (Hib) vaccine  You may need this if you have certain conditions. You may receive vaccines as individual doses or as more than one vaccine together in one shot (combination vaccines). Talk with your health care provider about the risks and benefits of combination vaccines. What tests do I need?  Blood tests  Lipid and cholesterol levels. These may be checked every 5 years starting at age 70.  Hepatitis C test.  Hepatitis B test. Screening  Diabetes screening. This is done by checking your blood sugar (glucose) after you have not eaten for a while (fasting).  Sexually transmitted disease (STD) testing.  BRCA-related cancer screening. This may be done if you have a family history of breast, ovarian, tubal, or peritoneal cancers.  Pelvic exam and Pap test. This may be done every 3 years starting at age 27. Starting at age 31, this may be done every 5 years if you have a Pap test in combination  with an HPV test. Talk with your health care provider about your test results, treatment options, and if necessary, the need for more tests. Follow these instructions at home: Eating and drinking   Eat a diet that includes fresh fruits and vegetables, whole grains, lean protein, and low-fat dairy.  Take vitamin and mineral supplements as recommended by  your health care provider.  Do not drink alcohol if: ? Your health care provider tells you not to drink. ? You are pregnant, may be pregnant, or are planning to become pregnant.  If you drink alcohol: ? Limit how much you have to 0-1 drink a day. ? Be aware of how much alcohol is in your drink. In the [place] one drink equals one 12 oz bottle of beer (355 mL), one 5 oz glass of wine (148 mL), or one 1 oz glass of hard liquor (44 mL). Lifestyle  Take daily care of your teeth and gums.  Stay active. Exercise for at least 30 minutes on 5 or more days each week.  Do not use any products that contain nicotine or tobacco, such as cigarettes, e-cigarettes, and chewing tobacco. If you need help quitting, ask your health care provider.  If you are sexually active, practice safe sex. Use a condom or other form of birth control (contraception) in order to prevent pregnancy and STIs (sexually transmitted infections). If you plan to become pregnant, see your health care provider for a preconception visit. What's next?  Visit your health care provider once a year for a well check visit.  Ask your health care provider how often you should have your eyes and teeth checked.  Stay up to date on all vaccines. This information is not intended to replace advice given to you by your health care provider. Make sure you discuss any questions you have with your health care provider. Document Revised: 06/15/2018 Document Reviewed: 06/15/2018 Elsevier Patient Education  2020 [employer].

## 2019-11-20 NOTE — Telephone Encounter (Signed)
Labs ordered.

## 2019-12-07 ENCOUNTER — Encounter: Payer: Self-pay | Admitting: Internal Medicine

## 2019-12-10 ENCOUNTER — Encounter: Payer: Self-pay | Admitting: Internal Medicine

## 2019-12-19 ENCOUNTER — Encounter: Payer: Self-pay | Admitting: Internal Medicine

## 2019-12-20 ENCOUNTER — Telehealth: Payer: Self-pay | Admitting: Internal Medicine

## 2019-12-20 NOTE — Telephone Encounter (Signed)
Pt is needing to have a physical done before her surgery on 4/16. It is required to be done and in before 3/23. Pt's pcp Ardyth Harps) is not here for her cpe to be scheduled. Pt wanted me to ask if another PCP would be willing to do so in order for her to be able to have her surgery next month. Pt is requesting dates between 16-20 of this month but would really prefer if it could be done on the 16th.  Pt can be reached at [phone]

## 2019-12-21 NOTE — Telephone Encounter (Signed)
Spoke with patient and appointment scheduled with Padonda.

## 2019-12-21 NOTE — Telephone Encounter (Signed)
Will Padonda do a cpe on that day with her?

## 2019-12-21 NOTE — Telephone Encounter (Signed)
It looks like she has an appt on 3/16 already with Padonda..If they can't do the cpe that day, you can schedule her with Dr. [place].

## 2019-12-23 ENCOUNTER — Encounter: Payer: Self-pay | Admitting: Internal Medicine

## 2019-12-25 ENCOUNTER — Encounter: Payer: Self-pay | Admitting: Internal Medicine

## 2020-01-01 ENCOUNTER — Ambulatory Visit: Payer: 59 | Admitting: Family

## 2020-01-01 ENCOUNTER — Other Ambulatory Visit: Payer: 59

## 2020-01-01 ENCOUNTER — Encounter: Payer: Self-pay | Admitting: Family

## 2020-01-01 ENCOUNTER — Other Ambulatory Visit: Payer: Self-pay

## 2020-01-01 ENCOUNTER — Encounter: Payer: Self-pay | Admitting: Internal Medicine

## 2020-01-01 VITALS — BP 110/76 | HR 84 | Temp 97.3°F | Ht 67.0 in | Wt 213.3 lb

## 2020-01-01 DIAGNOSIS — Z411 Encounter for cosmetic surgery: Secondary | ICD-10-CM

## 2020-01-01 DIAGNOSIS — Z01812 Encounter for preprocedural laboratory examination: Secondary | ICD-10-CM

## 2020-01-01 DIAGNOSIS — E559 Vitamin D deficiency, unspecified: Secondary | ICD-10-CM | POA: Diagnosis not present

## 2020-01-01 LAB — CBC WITH DIFFERENTIAL/PLATELET
Basophils Absolute: 0 10*3/uL (ref 0.0–0.1)
Basophils Relative: 0.5 % (ref 0.0–3.0)
Eosinophils Absolute: 0.2 10*3/uL (ref 0.0–0.7)
Eosinophils Relative: 4.8 % (ref 0.0–5.0)
HCT: 38.6 % (ref 36.0–46.0)
Hemoglobin: 12.6 g/dL (ref 12.0–15.0)
Lymphocytes Relative: 32.1 % (ref 12.0–46.0)
Lymphs Abs: 1.4 10*3/uL (ref 0.7–4.0)
MCHC: 32.6 g/dL (ref 30.0–36.0)
MCV: 85.9 fl (ref 78.0–100.0)
Monocytes Absolute: 0.3 10*3/uL (ref 0.1–1.0)
Monocytes Relative: 6.9 % (ref 3.0–12.0)
Neutro Abs: 2.4 10*3/uL (ref 1.4–7.7)
Neutrophils Relative %: 55.7 % (ref 43.0–77.0)
Platelets: 268 10*3/uL (ref 150.0–400.0)
RBC: 4.49 Mil/uL (ref 3.87–5.11)
RDW: 13.8 % (ref 11.5–15.5)
WBC: 4.3 10*3/uL (ref 4.0–10.5)

## 2020-01-01 LAB — PROTIME-INR
INR: 1.1 ratio — ABNORMAL HIGH (ref 0.8–1.0)
Prothrombin Time: 12.5 s (ref 9.6–13.1)

## 2020-01-01 LAB — APTT: aPTT: 38.5 s — ABNORMAL HIGH (ref 23.4–32.7)

## 2020-01-01 LAB — VITAMIN D 25 HYDROXY (VIT D DEFICIENCY, FRACTURES): VITD: 44.58 ng/mL (ref 30.00–100.00)

## 2020-01-01 NOTE — Progress Notes (Signed)
Established Patient Office Visit  Subjective:  Patient ID: Julia Ruiz, female    DOB: [birth year]  Age: 34 y.o. MRN: [id number]  CC:  Chief Complaint  Patient presents with  . physical    HPI Julia Ruiz presents for surgery clearance for [facility] that she will have done in [facility] February 01, 2020. She denies any health concerns She will need pre-surgery labs. She reports they questioned a birth mark that has been present since birth and never caused any issues. She will stay in [place] under the physician/nurse care for 5 days.  Past Medical History:  Diagnosis Date  . Abnormal cervical Papanicolaou smear    as teenager, normal since per her report  . Complication of anesthesia    spinal headache  . Genital herpes   . Gestational diabetes    with first pregnancy   . Obesity (BMI 30.0-34.9)   . Seasonal allergies   . Vitamin D deficiency     Past Surgical History:  Procedure Laterality Date  . CESAREAN SECTION    . CESAREAN SECTION    . CESAREAN SECTION N/A 06/29/2017   Procedure: REPEAT CESAREAN SECTION;  Surgeon: Osborn Coho, MD;  Location: [facility];  Service: Obstetrics;  Laterality: N/A;  Tracey RNFA    Family History  Problem Relation Age of Onset  . Mental illness Brother   . Irritable bowel syndrome Mother     Social History   Socioeconomic History  . Marital status: Married    Spouse name: Not on file  . Number of children: Not on file  . Years of education: Not on file  . Highest education level: Not on file  Occupational History  . Not on file  Tobacco Use  . Smoking status: Never Smoker  . Smokeless tobacco: Never Used  Substance and Sexual Activity  . Alcohol use: Yes    Comment: occasional  . Drug use: No  . Sexual activity: Yes  Other Topics Concern  . Not on file  Social History Narrative   ** Merged History Encounter **       Work or School: Airline pilot - registration      Home Situation: lives husband and  2 children      Spiritual Beliefs: Christian      Lifestyle: no regular exercise; diet is good   Social Determinants of Corporate investment banker Strain:   . Difficulty of Paying Living Expenses:   Food Insecurity:   . Worried About Programme researcher, broadcasting/film/video in the Last Year:   . Barista in the Last Year:   Transportation Needs:   . Freight forwarder (Medical):   Marland Kitchen Lack of Transportation (Non-Medical):   Physical Activity:   . Days of Exercise per Week:   . Minutes of Exercise per Session:   Stress:   . Feeling of Stress :   Social Connections:   . Frequency of Communication with Friends and Family:   . Frequency of Social Gatherings with Friends and Family:   . Attends Religious Services:   . Active Member of Clubs or Organizations:   . Attends Banker Meetings:   Marland Kitchen Marital Status:   Intimate Partner Violence:   . Fear of Current or Ex-Partner:   . Emotionally Abused:   Marland Kitchen Physically Abused:   . Sexually Abused:     Outpatient Medications Prior to Visit  Medication Sig Dispense Refill  . Ergocalciferol (VITAMIN D2 PO) Take  by mouth.    . Multiple Vitamin (MULTIVITAMIN WITH MINERALS) TABS tablet Take 1 tablet by mouth daily.    . Multiple Vitamins-Minerals (CELEBRATE MULTI-COMPLETE 18 PO) Take by mouth.    Marland Kitchen UNABLE TO FIND Med Name: Hema plex     No facility-administered medications prior to visit.    Allergies  Allergen Reactions  . Tetracyclines & Related Hives, Shortness Of Breath and Other (See Comments)    wheezing  . Clindagel [Clindamycin] Itching    ROS Review of Systems  All other systems reviewed and are negative.     Objective:    Physical Exam  Constitutional: She is oriented to person, place, and time. She appears well-developed and well-nourished.  HENT:  Head: Normocephalic and atraumatic.  Right Ear: External ear normal.  Left Ear: External ear normal.  Nose: Nose normal.  Mouth/Throat: Oropharynx is clear and  moist.  Eyes: Pupils are equal, round, and reactive to light. Conjunctivae and EOM are normal.  Neck: No thyromegaly present.  Cardiovascular: Normal rate, regular rhythm, normal heart sounds and intact distal pulses.  Pulmonary/Chest: Effort normal and breath sounds normal.  Abdominal: Soft. Bowel sounds are normal.  Musculoskeletal:        General: Normal range of motion.     Cervical back: Normal range of motion and neck supple.  Neurological: She is alert and oriented to person, place, and time. She has normal reflexes.  Skin: Skin is warm and dry.  Psychiatric: She has a normal mood and affect.    BP 110/76 (BP Location: Right Arm, Patient Position: Sitting, Cuff Size: Large)   Pulse 84   Temp (!) 97.3 F (36.3 C) (Temporal)   Ht 5\' 7"  (1.702 m)   Wt 213 lb 4.8 oz (96.8 kg)   LMP 12/15/2019 (Approximate)   SpO2 96%   Breastfeeding No   BMI 33.41 kg/m  Wt Readings from Last 3 Encounters:  01/01/20 213 lb 4.8 oz (96.8 kg)  11/16/19 214 lb 8 oz (97.3 kg)  11/02/19 218 lb 1.6 oz (98.9 kg)     There are no preventive care reminders to display for this patient.  There are no preventive care reminders to display for this patient.  Lab Results  Component Value Date   TSH 0.78 03/26/2015   Lab Results  Component Value Date   WBC 9.5 12/27/2017   HGB 14.6 12/27/2017   HCT 43.0 12/27/2017   MCV 87.5 12/27/2017   PLT 292 12/27/2017   Lab Results  Component Value Date   NA 140 12/27/2017   K 3.8 12/27/2017   CO2 24 07/11/2017   GLUCOSE 116 (H) 12/27/2017   BUN 8 12/27/2017   CREATININE 0.80 12/27/2017   BILITOT 1.2 07/11/2017   ALKPHOS 88 07/11/2017   AST 27 07/11/2017   ALT 38 07/11/2017   PROT 6.8 07/11/2017   ALBUMIN 3.2 (L) 07/11/2017   CALCIUM 9.0 07/11/2017   ANIONGAP 9 07/11/2017   Lab Results  Component Value Date   CHOL 170 03/21/2018   Lab Results  Component Value Date   HDL 46.70 03/21/2018   Lab Results  Component Value Date   LDLCALC 96  09/02/2016   Lab Results  Component Value Date   TRIG 78.0 09/02/2016   Lab Results  Component Value Date   CHOLHDL 3 09/02/2016   Lab Results  Component Value Date   HGBA1C 5.1 03/21/2018      Assessment & Plan:   Problem List Items Addressed This Visit  Vitamin D deficiency    Other Visit Diagnoses    Encounter for cosmetic procedure    -  Primary   Pre-operative laboratory examination          No orders of the defined types were placed in this encounter.   Follow-up: Julia Ruiz was seen today for physical.  Diagnoses and all orders for this visit:  Encounter for cosmetic procedure  Pre-operative laboratory examination -     COMPLETE METABOLIC PANEL WITH GFR -     Protime-INR -     APTT -     HCG, Qualitative -     HIV antibody (with reflex) -     CBC with Differential/Platelet  Vitamin D deficiency -     VITAMIN D 25 Hydroxy (Vit-D Deficiency, Fractures)     Eulis Foster, FNP

## 2020-01-02 ENCOUNTER — Encounter: Payer: Self-pay | Admitting: Internal Medicine

## 2020-01-02 LAB — COMPLETE METABOLIC PANEL WITH GFR
AG Ratio: 1.6 (calc) (ref 1.0–2.5)
ALT: 17 U/L (ref 6–29)
AST: 13 U/L (ref 10–30)
Albumin: 4.4 g/dL (ref 3.6–5.1)
Alkaline phosphatase (APISO): 47 U/L (ref 31–125)
BUN: 9 mg/dL (ref 7–25)
CO2: 24 mmol/L (ref 20–32)
Calcium: 9.5 mg/dL (ref 8.6–10.2)
Chloride: 106 mmol/L (ref 98–110)
Creat: 0.64 mg/dL (ref 0.50–1.10)
GFR, Est African American: 135 mL/min/{1.73_m2} (ref >=60)
GFR, Est Non African American: 116 mL/min/{1.73_m2} (ref >=60)
Globulin: 2.7 g/dL (calc) (ref 1.9–3.7)
Glucose, Bld: 99 mg/dL (ref 65–99)
Potassium: 4.1 mmol/L (ref 3.5–5.3)
Sodium: 139 mmol/L (ref 135–146)
Total Bilirubin: 0.9 mg/dL (ref 0.2–1.2)
Total Protein: 7.1 g/dL (ref 6.1–8.1)

## 2020-01-02 LAB — HIV ANTIBODY (ROUTINE TESTING W REFLEX): HIV 1&2 Ab, 4th Generation: NONREACTIVE

## 2020-01-02 LAB — HCG, SERUM, QUALITATIVE: Preg, Serum: NEGATIVE

## 2020-01-03 ENCOUNTER — Encounter: Payer: Self-pay | Admitting: Family

## 2020-01-03 ENCOUNTER — Encounter: Payer: Self-pay | Admitting: *Deleted

## 2020-01-04 ENCOUNTER — Other Ambulatory Visit: Payer: Self-pay | Admitting: Internal Medicine

## 2020-01-04 ENCOUNTER — Other Ambulatory Visit: Payer: 59

## 2020-01-04 ENCOUNTER — Other Ambulatory Visit: Payer: Self-pay

## 2020-01-04 DIAGNOSIS — Z01812 Encounter for preprocedural laboratory examination: Secondary | ICD-10-CM

## 2020-01-04 NOTE — Telephone Encounter (Signed)
Y 

## 2020-01-04 NOTE — Telephone Encounter (Signed)
Yes, the form is complete. I did mention that the birthmark as benign and not a concern.

## 2020-01-07 ENCOUNTER — Encounter: Payer: Self-pay | Admitting: Internal Medicine

## 2020-01-07 ENCOUNTER — Telehealth: Payer: Self-pay | Admitting: Internal Medicine

## 2020-01-07 ENCOUNTER — Other Ambulatory Visit: Payer: Self-pay

## 2020-01-07 ENCOUNTER — Other Ambulatory Visit (INDEPENDENT_AMBULATORY_CARE_PROVIDER_SITE_OTHER): Payer: 59

## 2020-01-07 DIAGNOSIS — Z01812 Encounter for preprocedural laboratory examination: Secondary | ICD-10-CM | POA: Diagnosis not present

## 2020-01-07 LAB — APTT: aPTT: 37.6 s — ABNORMAL HIGH (ref 23.4–32.7)

## 2020-01-07 LAB — PROTIME-INR
INR: 1.1 ratio — ABNORMAL HIGH (ref 0.8–1.0)
Prothrombin Time: 12.4 s (ref 9.6–13.1)

## 2020-01-07 NOTE — Telephone Encounter (Signed)
Pt would like a call back about her results from her lab appt today. Pt can be reached at [phone]

## 2020-01-08 ENCOUNTER — Telehealth (INDEPENDENT_AMBULATORY_CARE_PROVIDER_SITE_OTHER): Payer: 59 | Admitting: Internal Medicine

## 2020-01-08 DIAGNOSIS — R791 Abnormal coagulation profile: Secondary | ICD-10-CM | POA: Diagnosis not present

## 2020-01-08 NOTE — Progress Notes (Signed)
Virtual Visit via Video Note  I connected with Julia Ruiz on 01/08/20 at  1:00 PM EDT by a video enabled telemedicine application and verified that I am speaking with the correct person using two identifiers.  Location patient: home Location provider: work office Persons participating in the virtual visit: patient, provider  I discussed the limitations of evaluation and management by telemedicine and the availability of in person appointments. The patient expressed understanding and agreed to proceed.   HPI: She is having cosmetic surgery that is scheduled for mid April.  During her preop clearance labs that were requested by her surgeon in [place] she was found to have a slightly elevated PTT to 38.5.  It was re-done and again was slightly elevated at 37.6.  It appears that her surgeon is hesitant to proceed to surgery because of this.  She is disappointed as she has spent a lot of money and time planning for this.  She has had 2 C-sections without excessive bleeding, has not noticed excessive bleeding in the past with minor cuts and wounds.   ROS: Constitutional: Denies fever, chills, diaphoresis, appetite change and fatigue.  HEENT: Denies photophobia, eye pain, redness, hearing loss, ear pain, congestion, sore throat, rhinorrhea, sneezing, mouth sores, trouble swallowing, neck pain, neck stiffness and tinnitus.   Respiratory: Denies SOB, DOE, cough, chest tightness,  and wheezing.   Cardiovascular: Denies chest pain, palpitations and leg swelling.  Gastrointestinal: Denies nausea, vomiting, abdominal pain, diarrhea, constipation, blood in stool and abdominal distention.  Genitourinary: Denies dysuria, urgency, frequency, hematuria, flank pain and difficulty urinating.  Endocrine: Denies: hot or cold intolerance, sweats, changes in hair or nails, polyuria, polydipsia. Musculoskeletal: Denies myalgias, back pain, joint swelling, arthralgias and gait problem.  Skin: Denies  pallor, rash and wound.  Neurological: Denies dizziness, seizures, syncope, weakness, light-headedness, numbness and headaches.  Hematological: Denies adenopathy. Easy bruising, personal or family bleeding history  Psychiatric/Behavioral: Denies suicidal ideation, mood changes, confusion, nervousness, sleep disturbance and agitation   Past Medical History:  Diagnosis Date  . Abnormal cervical Papanicolaou smear    as teenager, normal since per her report  . Complication of anesthesia    spinal headache  . Genital herpes   . Gestational diabetes    with first pregnancy   . Obesity (BMI 30.0-34.9)   . Seasonal allergies   . Vitamin D deficiency     Past Surgical History:  Procedure Laterality Date  . CESAREAN SECTION    . CESAREAN SECTION    . CESAREAN SECTION N/A 06/29/2017   Procedure: REPEAT CESAREAN SECTION;  Surgeon: Everett Graff, MD;  Location: [facility];  Service: Obstetrics;  Laterality: N/A;  Tracey RNFA    Family History  Problem Relation Age of Onset  . Mental illness Brother   . Irritable bowel syndrome Mother     SOCIAL HX:   reports that she has never smoked. She has never used smokeless tobacco. She reports current alcohol use. She reports that she does not use drugs.   Current Outpatient Medications:  .  Ergocalciferol (VITAMIN D2 PO), Take by mouth., Disp: , Rfl:  .  Multiple Vitamin (MULTIVITAMIN WITH MINERALS) TABS tablet, Take 1 tablet by mouth daily., Disp: , Rfl:  .  Multiple Vitamins-Minerals (CELEBRATE MULTI-COMPLETE 18 PO), Take by mouth., Disp: , Rfl:  .  UNABLE TO FIND, Med Name: Hema plex, Disp: , Rfl:   EXAM:   VITALS per patient if applicable: None reported  GENERAL: alert, oriented, appears  well and in no acute distress  HEENT: atraumatic, conjunttiva clear, no obvious abnormalities on inspection of external nose and ears  NECK: normal movements of the head and neck  LUNGS: on inspection no signs of respiratory distress,  breathing rate appears normal, no obvious gross increased work of breathing, gasping or wheezing  CV: no obvious cyanosis  MS: moves all visible extremities without noticeable abnormality  PSYCH/NEURO: pleasant and cooperative, no obvious depression or anxiety, speech and thought processing grossly intact  ASSESSMENT AND PLAN:   Elevated partial thromboplastin time (PTT) -At the insistence of her surgeon, I will refer her today to hematology for their opinion although I doubt that the slightly elevated PTT would pose any bleeding risk for her. -She is not taking any anticoagulants including aspirin.    I discussed the assessment and treatment plan with the patient. The patient was provided an opportunity to ask questions and all were answered. The patient agreed with the plan and demonstrated an understanding of the instructions.   The patient was advised to call back or seek an in-person evaluation if the symptoms worsen or if the condition fails to improve as anticipated.    Chaya Jan, MD  [facility]

## 2020-01-08 NOTE — Telephone Encounter (Signed)
She can schedule a virtual visit if she would like.  EH

## 2020-01-08 NOTE — Telephone Encounter (Signed)
virtual visit scheduled

## 2020-01-09 ENCOUNTER — Telehealth: Payer: Self-pay | Admitting: Hematology and Oncology

## 2020-01-09 NOTE — Telephone Encounter (Signed)
Received a new hem referral from Dr. Ardyth Harps for Elevated partial thromboplastin time (PTT). Julia Ruiz has been cld and scheduled to see Dr. Leonides Schanz on 3/31 at 1030am. Pt aware to arrive 15 minutes early.

## 2020-01-15 NOTE — Progress Notes (Deleted)
[facility] [phone]   Fax:([phone]) [place] NOTE  Patient Care Team: Isaac Bliss, Rayford Halsted, MD as PCP - General (Internal Medicine) Patient, No Pcp Per (General Practice)  Hematological/Oncological History # ***  CHIEF COMPLAINTS/PURPOSE OF CONSULTATION:  "*** "  HISTORY OF PRESENTING ILLNESS:  Julia Ruiz [age] y.o. female with medical history significant for ***  On review of the previous records ***  On exam today ***  MEDICAL HISTORY:  Past Medical History:  Diagnosis Date  . Abnormal cervical Papanicolaou smear    as teenager, normal since per her report  . Complication of anesthesia    spinal headache  . Genital herpes   . Gestational diabetes    with first pregnancy   . Obesity (BMI 30.0-34.9)   . Seasonal allergies   . Vitamin D deficiency     SURGICAL HISTORY: Past Surgical History:  Procedure Laterality Date  . CESAREAN SECTION    . CESAREAN SECTION    . CESAREAN SECTION N/A 06/29/2017   Procedure: REPEAT CESAREAN SECTION;  Surgeon: Everett Graff, MD;  Location: [facility];  Service: Obstetrics;  Laterality: N/A;  Tracey RNFA    SOCIAL HISTORY: Social History   Socioeconomic History  . Marital status: Married    Spouse name: Not on file  . Number of children: Not on file  . Years of education: Not on file  . Highest education level: Not on file  Occupational History  . Not on file  Tobacco Use  . Smoking status: Never Smoker  . Smokeless tobacco: Never Used  Substance and Sexual Activity  . Alcohol use: Yes    Comment: occasional  . Drug use: No  . Sexual activity: Yes  Other Topics Concern  . Not on file  Social History Narrative   ** Merged History Encounter **       Work or School: Estate manager/land agent - registration      Home Situation: lives husband and 2 children      Spiritual Beliefs: Christian      Lifestyle: no regular exercise; diet is good   Social Determinants  of Radio broadcast assistant Strain:   . Difficulty of Paying Living Expenses:   Food Insecurity:   . Worried About Charity fundraiser in the Last Year:   . Arboriculturist in the Last Year:   Transportation Needs:   . Film/video editor (Medical):   Julia Ruiz Lack of Transportation (Non-Medical):   Physical Activity:   . Days of Exercise per Week:   . Minutes of Exercise per Session:   Stress:   . Feeling of Stress :   Social Connections:   . Frequency of Communication with Friends and Family:   . Frequency of Social Gatherings with Friends and Family:   . Attends Religious Services:   . Active Member of Clubs or Organizations:   . Attends Archivist Meetings:   Julia Ruiz Marital Status:   Intimate Partner Violence:   . Fear of Current or Ex-Partner:   . Emotionally Abused:   Julia Ruiz Physically Abused:   . Sexually Abused:     FAMILY HISTORY: Family History  Problem Relation Age of Onset  . Mental illness Brother   . Irritable bowel syndrome Mother     ALLERGIES:  is allergic to tetracyclines & related and clindagel [clindamycin].  MEDICATIONS:  Current Outpatient Medications  Medication Sig Dispense Refill  . Ergocalciferol (VITAMIN D2 PO) Take by  mouth.    . Multiple Vitamin (MULTIVITAMIN WITH MINERALS) TABS tablet Take 1 tablet by mouth daily.    . Multiple Vitamins-Minerals (CELEBRATE MULTI-COMPLETE 18 PO) Take by mouth.    Julia Ruiz UNABLE TO FIND Med Name: Hema plex     No current facility-administered medications for this visit.    REVIEW OF SYSTEMS:   Constitutional: ( - ) fevers, ( - )  chills , ( - ) night sweats Eyes: ( - ) blurriness of vision, ( - ) double vision, ( - ) watery eyes Ears, nose, mouth, throat, and face: ( - ) mucositis, ( - ) sore throat Respiratory: ( - ) cough, ( - ) dyspnea, ( - ) wheezes Cardiovascular: ( - ) palpitation, ( - ) chest discomfort, ( - ) lower extremity swelling Gastrointestinal:  ( - ) nausea, ( - ) heartburn, ( - ) change in  bowel habits Skin: ( - ) abnormal skin rashes Lymphatics: ( - ) new lymphadenopathy, ( - ) easy bruising Neurological: ( - ) numbness, ( - ) tingling, ( - ) new weaknesses Behavioral/Psych: ( - ) mood change, ( - ) new changes  All other systems were reviewed with the patient and are negative.  PHYSICAL EXAMINATION: ECOG PERFORMANCE STATUS: {CHL ONC ECOG [id number]}  There were no vitals filed for this visit. There were no vitals filed for this visit.  GENERAL: well appearing *** in NAD  SKIN: skin color, texture, turgor are normal, no rashes or significant lesions EYES: conjunctiva are pink and non-injected, sclera clear OROPHARYNX: no exudate, no erythema; lips, buccal mucosa, and tongue normal  NECK: supple, non-tender LYMPH:  no palpable lymphadenopathy in the cervical, axillary or inguinal LUNGS: clear to auscultation and percussion with normal breathing effort HEART: regular rate & rhythm and no murmurs and no lower extremity edema ABDOMEN: soft, non-tender, non-distended, normal bowel sounds Musculoskeletal: no cyanosis of digits and no clubbing  PSYCH: alert & oriented x 3, fluent speech NEURO: no focal motor/sensory deficits  LABORATORY DATA:  I have reviewed the data as listed CBC Latest Ref Rng & Units 01/01/2020 12/27/2017 12/27/2017  WBC 4.0 - 10.5 K/uL 4.3 - 9.5  Hemoglobin 12.0 - 15.0 g/dL 36.6 29.4 76.5  Hematocrit 36.0 - 46.0 % 38.6 43.0 39.9  Platelets 150.0 - 400.0 K/uL 268.0 - 292    CMP Latest Ref Rng & Units 01/01/2020 12/27/2017 07/11/2017  Glucose 65 - 99 mg/dL 99 465(K) 89  BUN 7 - 25 mg/dL 9 8 11   Creatinine 0.50 - 1.10 mg/dL 3.54 6.56  Sodium 135 - 146 mmol/L 139 140 138  Potassium 3.5 - 5.3 mmol/L 4.1 3.8 3.9  Chloride 98 - 110 mmol/L 106 105 105  CO2 20 - 32 mmol/L 24 - 24  Calcium 8.6 - 10.2 mg/dL 9.5 - 9.0  Total Protein 6.1 - 8.1 g/dL 7.1 - 6.8  Total Bilirubin 0.2 - 1.2 mg/dL 0.9 - 1.2  Alkaline Phos 38 - 126 U/L - - 88  AST 10 - 30  U/L 13 - 27  ALT 6 - 29 U/L 17 - 38     PATHOLOGY: ***  BLOOD FILM: *** Review of the peripheral blood smear showed normal appearing white cells with neutrophils that were appropriately lobated and granulated. There was no predominance of bi-lobed or hyper-segmented neutrophils appreciated. No Dohle bodies were noted. There was no left shifting, immature forms or blasts noted. Lymphocytes remain normal in size without any predominance of large granular lymphocytes. Red  cells show no anisopoikilocytosis, macrocytes , microcytes or polychromasia. There were no schistocytes, target cells, echinocytes, acanthocytes, dacrocytes, or stomatocytes.There was no rouleaux formation, nucleated red cells, or intra-cellular inclusions noted. The platelets are normal in size, shape, and color without any clumping evident.  RADIOGRAPHIC STUDIES: I have personally reviewed the radiological images as listed and agreed with the findings in the report. No results found.  ASSESSMENT & PLAN ***  No orders of the defined types were placed in this encounter.   All questions were answered. The patient knows to call the clinic with any problems, questions or concerns.  A total of more than {CHL ONC TIME VISIT - [id number]} were spent on this encounter and over half of that time was spent on counseling and coordination of care as outlined above.   Ulysees Barns, MD Department of Hematology/Oncology [facility] Phone: [phone] Pager: [phone] Email: [email or web address]  01/15/2020 4:27 PM

## 2020-01-16 ENCOUNTER — Encounter: Payer: Self-pay | Admitting: Hematology and Oncology

## 2020-01-16 ENCOUNTER — Other Ambulatory Visit: Payer: Self-pay

## 2020-01-16 ENCOUNTER — Inpatient Hospital Stay: Payer: 59 | Attending: Hematology and Oncology | Admitting: Hematology and Oncology

## 2020-01-16 ENCOUNTER — Inpatient Hospital Stay: Payer: 59 | Admitting: Hematology and Oncology

## 2020-01-16 ENCOUNTER — Inpatient Hospital Stay: Payer: 59

## 2020-01-16 VITALS — BP 110/73 | HR 92 | Temp 98.3°F | Resp 17 | Ht 67.0 in | Wt 215.3 lb

## 2020-01-16 DIAGNOSIS — Z975 Presence of (intrauterine) contraceptive device: Secondary | ICD-10-CM | POA: Diagnosis not present

## 2020-01-16 DIAGNOSIS — N920 Excessive and frequent menstruation with regular cycle: Secondary | ICD-10-CM | POA: Insufficient documentation

## 2020-01-16 DIAGNOSIS — Z8249 Family history of ischemic heart disease and other diseases of the circulatory system: Secondary | ICD-10-CM | POA: Diagnosis not present

## 2020-01-16 DIAGNOSIS — R791 Abnormal coagulation profile: Secondary | ICD-10-CM

## 2020-01-16 LAB — CMP (CANCER CENTER ONLY)
ALT: 14 U/L (ref 0–44)
AST: 12 U/L — ABNORMAL LOW (ref 15–41)
Albumin: 4 g/dL (ref 3.5–5.0)
Alkaline Phosphatase: 52 U/L (ref 38–126)
Anion gap: 11 (ref 5–15)
BUN: 8 mg/dL (ref 6–20)
CO2: 25 mmol/L (ref 22–32)
Calcium: 9.3 mg/dL (ref 8.9–10.3)
Chloride: 106 mmol/L (ref 98–111)
Creatinine: 0.79 mg/dL (ref 0.44–1.00)
GFR, Est AFR Am: >60 mL/min (ref >60)
GFR, Estimated: >60 mL/min (ref >60)
Glucose, Bld: 87 mg/dL (ref 70–99)
Potassium: 3.5 mmol/L (ref 3.5–5.1)
Sodium: 142 mmol/L (ref 135–145)
Total Bilirubin: 1.1 mg/dL (ref 0.3–1.2)
Total Protein: 7.6 g/dL (ref 6.5–8.1)

## 2020-01-16 LAB — CBC WITH DIFFERENTIAL (CANCER CENTER ONLY)
Abs Immature Granulocytes: 0.02 10*3/uL (ref 0.00–0.07)
Basophils Absolute: 0 10*3/uL (ref 0.0–0.1)
Basophils Relative: 1 %
Eosinophils Absolute: 0.2 10*3/uL (ref 0.0–0.5)
Eosinophils Relative: 3 %
HCT: 37.4 % (ref 36.0–46.0)
Hemoglobin: 11.9 g/dL — ABNORMAL LOW (ref 12.0–15.0)
Immature Granulocytes: 0 %
Lymphocytes Relative: 29 %
Lymphs Abs: 1.7 10*3/uL (ref 0.7–4.0)
MCH: 27.9 pg (ref 26.0–34.0)
MCHC: 31.8 g/dL (ref 30.0–36.0)
MCV: 87.6 fL (ref 80.0–100.0)
Monocytes Absolute: 0.4 10*3/uL (ref 0.1–1.0)
Monocytes Relative: 6 %
Neutro Abs: 3.5 10*3/uL (ref 1.7–7.7)
Neutrophils Relative %: 61 %
Platelet Count: 308 10*3/uL (ref 150–400)
RBC: 4.27 MIL/uL (ref 3.87–5.11)
RDW: 13.1 % (ref 11.5–15.5)
WBC Count: 5.8 10*3/uL (ref 4.0–10.5)
nRBC: 0 % (ref 0.0–0.2)

## 2020-01-16 LAB — PROTIME-INR
INR: 1 (ref 0.8–1.2)
Prothrombin Time: 13.2 seconds (ref 11.4–15.2)

## 2020-01-16 LAB — APTT: aPTT: 32 seconds (ref 24–36)

## 2020-01-16 NOTE — Progress Notes (Signed)
[place] [phone]   Fax:([phone] [place] NOTE  Patient Care Team: Isaac Bliss, Rayford Halsted, MD as PCP - General (Internal Medicine) Patient, No Pcp Per (General Practice)  Hematological/Oncological History # Elevated aPTT 1) 01/01/2020: aPTT 38.5. PT 12.5, INR 1.1 2) 01/07/2020: aPTT 37.6. PT 12.4, INR 1.1 3) 01/16/2020: establish care with Dr. Lorenso Courier   CHIEF COMPLAINTS/PURPOSE OF CONSULTATION:  "Elevated PTT "  HISTORY OF PRESENTING ILLNESS:  Julia Ruiz [age] y.o. female with medical history significant for obesity, genital herpes, gestational diabetes, and seasonal allergies who presents for evaluation of a mildly elevated PTT.   On review of the previous records Julia Ruiz underwent preoperative examination on 01/01/2020 with her primary care provider.  As part of that clinic visit she had an APTT ordered which was elevated at 38.5.  The patient returned on 01/07/2020 for repeat study and was shown to have a level of 37.6.  Due to concern for the elevated APTT the patient was referred to hematology for further evaluation and management.  On exam today Julia Ruiz notes that she feels well.  She reports that she is heading to [facility] on 02/01/2020 to undergo plastic surgery with liposuction and a [place] butt lift.  She reports that she has not had any issues with bleeding, bruising, or dark stools.  Of note the patient has undergone 3 C-sections without any bleeding complications.  She notes that she has not had any other surgical procedures, but has never had any other incidences of heavy bleeding.  Of note though the patient reports that she had an IUD placed in 2018 and since that time she has had heavier menstrual cycles.  She notes that she goes through proximally 9-10 pads on the heaviest days of the period.  He notes that she occasionally feels drained from the amount of blood that she lost during that cycle.  On further discussion Ms.  Ruiz has no family history remarkable for bleeding or clotting disorders.  She notes that her mother is healthy and that her father only suffers from hypertension.  She is not currently taking any nutritional supplements other than vitamin K which she began taking when she learned that her PTT was elevated.  Prior to that she was taking iron supplements that she had found online.  Otherwise she denies having any fevers, chills, sweats, nausea, vomiting or diarrhea.  A full 10 point ROS is listed below.  MEDICAL HISTORY:  Past Medical History:  Diagnosis Date  . Abnormal cervical Papanicolaou smear    as teenager, normal since per her report  . Complication of anesthesia    spinal headache  . Genital herpes   . Gestational diabetes    with first pregnancy   . Obesity (BMI 30.0-34.9)   . Seasonal allergies   . Vitamin D deficiency     SURGICAL HISTORY: Past Surgical History:  Procedure Laterality Date  . CESAREAN SECTION    . CESAREAN SECTION    . CESAREAN SECTION N/A 06/29/2017   Procedure: REPEAT CESAREAN SECTION;  Surgeon: Everett Graff, MD;  Location: [place];  Service: Obstetrics;  Laterality: N/A;  Tracey RNFA    SOCIAL HISTORY: Social History   Socioeconomic History  . Marital status: Married    Spouse name: Not on file  . Number of children: Not on file  . Years of education: Not on file  . Highest education level: Not on file  Occupational History  . Not on file  Tobacco Use  . Smoking status: Never Smoker  . Smokeless tobacco: Never Used  Substance and Sexual Activity  . Alcohol use: Yes    Comment: occasional  . Drug use: No  . Sexual activity: Yes  Other Topics Concern  . Not on file  Social History Narrative   ** Merged History Encounter **       Work or School: Airline pilot - registration      Home Situation: lives husband and 2 children      Spiritual Beliefs: Christian      Lifestyle: no regular exercise; diet is good   Social  Determinants of Corporate investment banker Strain:   . Difficulty of Paying Living Expenses:   Food Insecurity:   . Worried About Programme researcher, broadcasting/film/video in the Last Year:   . Barista in the Last Year:   Transportation Needs:   . Freight forwarder (Medical):   Marland Kitchen Lack of Transportation (Non-Medical):   Physical Activity:   . Days of Exercise per Week:   . Minutes of Exercise per Session:   Stress:   . Feeling of Stress :   Social Connections:   . Frequency of Communication with Friends and Family:   . Frequency of Social Gatherings with Friends and Family:   . Attends Religious Services:   . Active Member of Clubs or Organizations:   . Attends Banker Meetings:   Marland Kitchen Marital Status:   Intimate Partner Violence:   . Fear of Current or Ex-Partner:   . Emotionally Abused:   Marland Kitchen Physically Abused:   . Sexually Abused:     FAMILY HISTORY: Family History  Problem Relation Age of Onset  . Mental illness Brother   . Irritable bowel syndrome Mother     ALLERGIES:  is allergic to tetracyclines & related and clindagel [clindamycin].  MEDICATIONS:  Current Outpatient Medications  Medication Sig Dispense Refill  . Ergocalciferol (VITAMIN D2 PO) Take by mouth.    . Multiple Vitamin (MULTIVITAMIN WITH MINERALS) TABS tablet Take 1 tablet by mouth daily.     No current facility-administered medications for this visit.    REVIEW OF SYSTEMS:   Constitutional: ( - ) fevers, ( - )  chills , ( - ) night sweats Eyes: ( - ) blurriness of vision, ( - ) double vision, ( - ) watery eyes Ears, nose, mouth, throat, and face: ( - ) mucositis, ( - ) sore throat Respiratory: ( - ) cough, ( - ) dyspnea, ( - ) wheezes Cardiovascular: ( - ) palpitation, ( - ) chest discomfort, ( - ) lower extremity swelling Gastrointestinal:  ( - ) nausea, ( - ) heartburn, ( - ) change in bowel habits Skin: ( - ) abnormal skin rashes Lymphatics: ( - ) new lymphadenopathy, ( - ) easy  bruising Neurological: ( - ) numbness, ( - ) tingling, ( - ) new weaknesses Behavioral/Psych: ( - ) mood change, ( - ) new changes  All other systems were reviewed with the patient and are negative.  PHYSICAL EXAMINATION: ECOG PERFORMANCE STATUS: 0 - Asymptomatic  Vitals:   01/16/20 1318  BP: 110/73  Pulse: 92  Resp: 17  Temp: 98.3 F (36.8 C)  SpO2: 100%   Filed Weights   01/16/20 1318  Weight: 215 lb 4.8 oz (97.7 kg)    GENERAL: well appearing middle aged [place] American female in NAD  SKIN: skin color, texture, turgor are normal, no rashes  or significant lesions EYES: conjunctiva are pink and non-injected, sclera clear LUNGS: clear to auscultation and percussion with normal breathing effort HEART: regular rate & rhythm and no murmurs and no lower extremity edema ABDOMEN: soft, non-tender, non-distended, normal bowel sounds. No HSM. Musculoskeletal: no cyanosis of digits and no clubbing  PSYCH: alert & oriented x 3, fluent speech NEURO: no focal motor/sensory deficits  LABORATORY DATA:  I have reviewed the data as listed CBC Latest Ref Rng & Units 01/16/2020 01/01/2020 12/27/2017  WBC 4.0 - 10.5 K/uL 5.8 4.3 -  Hemoglobin 12.0 - 15.0 g/dL 11.9(L) 12.6 14.6  Hematocrit 36.0 - 46.0 % 37.4 38.6 43.0  Platelets 150 - 400 K/uL 308 268.0 -    CMP Latest Ref Rng & Units 01/16/2020 01/01/2020 12/27/2017  Glucose 70 - 99 mg/dL 87 99 357(S)  BUN 6 - 20 mg/dL 8 9 8   Creatinine 0.44 - 1.00 mg/dL 1.77 9.39  Sodium 135 - 145 mmol/L 142 139 140  Potassium 3.5 - 5.1 mmol/L 3.5 4.1 3.8  Chloride 98 - 111 mmol/L 106 106 105  CO2 22 - 32 mmol/L 25 24 -  Calcium 8.9 - 10.3 mg/dL 9.3 9.5 -  Total Protein 6.5 - 8.1 g/dL 7.6 7.1 -  Total Bilirubin 0.3 - 1.2 mg/dL 1.1 0.9 -  Alkaline Phos 38 - 126 U/L 52 - -  AST 15 - 41 U/L 12(L) 13 -  ALT 0 - 44 U/L 14 17 -     PATHOLOGY: None relevant to review today.   RADIOGRAPHIC STUDIES: None relevant to review today.  No results  found.  ASSESSMENT & PLAN Julia Ruiz 34 y.o. female with medical history significant for obesity, genital herpes, gestational diabetes, and seasonal allergies who presents for evaluation of a mildly elevated PTT.  After review of the previous records, the previous labs, and discussion with the patient the findings are most consistent with a mild elevation in PTT likely not of clinical significance 20 Haemost. 2005 Dec;3(12):2607-11.).  The PTT correlates poorly with bleeding risk and therefore will not likely preclude the patient's planned surgery in the absence of other bleeding symptoms.  The patient does have increased menstrual bleeding, but otherwise has not had any issues with overt bleeding, bruising, or dark stools.  As such I do not believe this elevated PTT alone should prevent the patient from receiving her planned cosmetic surgery.  In order to assure that there is no concerning etiology for this elevated PTT we will do a thorough work-up today.  The differential for an isolated PTT includes factor deficiency, von Willebrand disease, lupus anticoagulant, DIC, liver disease, or anticoagulant use.  DIC and anticoagulant use based on the patient's history of our not likely.  We will work-up these other etiologies with extensive lab work today.  In the event that the patient's blood work has a concerning finding we can recommend for postponing the surgery, but at this time I do think it is appropriate for the surgery to go forward as planned.  This patient does not require routine follow-up in our clinic unless an abnormality is noted on the lab work below.  #Elevated aPTT --today will order repeat aPTT as well as CBC, CMP, and PT/INR --will order lupus anticoagulant studies to assess for possible etiology of isolated increased PTT --further studies to include Thrombin Time, PTT mixing study, and vWD panel --patient has taken Vitamin K since receiving the diagnosis --patient has had 3  uncomplicated C sections with no history  of bruising or bleeding. She has endorsed heavy periods, but otherwise no concerns for bleeding diathesis. --can consider further testing pending the results of the mixing study (individual factor components) --even with an elevated PTT the patient has demonstrated no signs concerning for a bleeding disorder. I would not think this PTT would prevent her from undergoing her scheduled cosmetic surgery --RTC pending the results of the above studies.   Orders Placed This Encounter  Procedures  . CBC with Differential (Cancer Center Only)    Standing Status:   Future    Number of Occurrences:   1    Standing Expiration Date:   01/15/2021  . CMP (Cancer Center only)    Standing Status:   Future    Number of Occurrences:   1    Standing Expiration Date:   01/15/2021  . Cardiolipin antibodies, IgG, IgM, IgA*    Standing Status:   Future    Number of Occurrences:   1    Standing Expiration Date:   01/15/2021  . Beta-2-glycoprotein i abs, IgG/M/A    Standing Status:   Future    Number of Occurrences:   1    Standing Expiration Date:   01/15/2021  . Lupus anticoagulant panel*    Standing Status:   Future    Number of Occurrences:   1    Standing Expiration Date:   01/15/2021  . APTT    Standing Status:   Future    Number of Occurrences:   1    Standing Expiration Date:   01/15/2021  . Protime-INR    Standing Status:   Future    Number of Occurrences:   1    Standing Expiration Date:   01/15/2021  . PTT factor inhibitor (mixing study)    Standing Status:   Future    Number of Occurrences:   1    Standing Expiration Date:   01/15/2021  . Thrombin time    Standing Status:   Future    Number of Occurrences:   1    Standing Expiration Date:   01/15/2021  . Von Willebrand panel    Standing Status:   Future    Standing Expiration Date:   01/15/2021   All questions were answered. The patient knows to call the clinic with any problems, questions or  concerns.  A total of more than 60 minutes were spent on this encounter and over half of that time was spent on counseling and coordination of care as outlined above.   Ulysees Barns, MD Department of Hematology/Oncology [facility] at [facility] Phone: [phone] Pager: [phone] Email: [email or web address]  01/16/2020 5:38 PM   [place] CS. To bleed or not to bleed? Is that the question for the PTT? J Thromb Haemost. 2005 Dec;3(12):2607-11.  --A prolonged PTT is not strongly predictive of hemorrhage nor does a normal PTT provide shelter against hemorrhagic risk.

## 2020-01-18 DIAGNOSIS — Z20828 Contact with and (suspected) exposure to other viral communicable diseases: Secondary | ICD-10-CM | POA: Diagnosis not present

## 2020-01-18 LAB — PTT FACTOR INHIBITOR (MIXING STUDY): aPTT: 26.9 s (ref 22.9–30.2)

## 2020-01-18 LAB — BETA-2-GLYCOPROTEIN I ABS, IGG/M/A
Beta-2 Glyco I IgG: <9 GPI IgG units (ref 0–20)
Beta-2-Glycoprotein I IgA: <9 GPI IgA units (ref 0–25)
Beta-2-Glycoprotein I IgM: <9 GPI IgM units (ref 0–32)

## 2020-01-18 LAB — LUPUS ANTICOAGULANT PANEL
DRVVT: 35.4 s (ref 0.0–47.0)
PTT Lupus Anticoagulant: 36 s (ref 0.0–51.9)

## 2020-01-18 LAB — CARDIOLIPIN ANTIBODIES, IGG, IGM, IGA
Anticardiolipin IgA: <9 APL U/mL (ref 0–11)
Anticardiolipin IgG: <9 GPL U/mL (ref 0–14)
Anticardiolipin IgM: <9 MPL U/mL (ref 0–12)

## 2020-01-18 LAB — THROMBIN TIME: Thrombin Time: 17.5 s (ref 0.0–23.0)

## 2020-02-10 ENCOUNTER — Encounter: Payer: Self-pay | Admitting: Internal Medicine

## 2020-02-27 ENCOUNTER — Other Ambulatory Visit: Payer: Self-pay

## 2020-02-28 ENCOUNTER — Encounter: Payer: Self-pay | Admitting: Internal Medicine

## 2020-02-28 ENCOUNTER — Ambulatory Visit (INDEPENDENT_AMBULATORY_CARE_PROVIDER_SITE_OTHER): Payer: 59 | Admitting: Internal Medicine

## 2020-02-28 VITALS — BP 110/80 | HR 88 | Temp 97.6°F | Wt 211.9 lb

## 2020-02-28 DIAGNOSIS — L0291 Cutaneous abscess, unspecified: Secondary | ICD-10-CM | POA: Diagnosis not present

## 2020-02-28 NOTE — Progress Notes (Signed)
Established Patient Office Visit     This visit occurred during the SARS-CoV-2 public health emergency.  Safety protocols were in place, including screening questions prior to the visit, additional usage of staff PPE, and extensive cleaning of exam room while observing appropriate contact time as indicated for disinfecting solutions.    CC/Reason for Visit: Abscess left breast  HPI: Julia Ruiz is a 34 y.o. female who is coming in today for the above mentioned reasons. 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 No breast intervention. She noticed a blackhead on the lateral side of her left breast. Has had a red, swollen area develop behind it. No fever.   Past Medical/Surgical History: Past Medical History:  Diagnosis Date  . Abnormal cervical Papanicolaou smear    as teenager, normal since per her report  . Complication of anesthesia    spinal headache  . Genital herpes   . Gestational diabetes    with first pregnancy   . Obesity (BMI 30.0-34.9)   . Seasonal allergies   . Vitamin D deficiency     Past Surgical History:  Procedure Laterality Date  . CESAREAN SECTION    . CESAREAN SECTION    . CESAREAN SECTION N/A 06/29/2017   Procedure: REPEAT CESAREAN SECTION;  Surgeon: Osborn Coho, MD;  Location: [facility];  Service: Obstetrics;  Laterality: N/A;  Tracey RNFA    Social History:  reports that she has never smoked. She has never used smokeless tobacco. She reports current alcohol use. She reports that she does not use drugs.  Allergies: Allergies  Allergen Reactions  . Tetracyclines & Related Hives, Shortness Of Breath and Other (See Comments)    wheezing  . Clindagel [Clindamycin] Itching    Family History:  Family History  Problem Relation Age of Onset  . Mental illness Brother   . Irritable bowel syndrome Mother      Current Outpatient Medications:  .  Ergocalciferol (VITAMIN D2 PO), Take by mouth., Disp: , Rfl:  .  Multiple Vitamin  (MULTIVITAMIN WITH MINERALS) TABS tablet, Take 1 tablet by mouth daily., Disp: , Rfl:   Review of Systems:  Constitutional: Denies fever, chills, diaphoresis, appetite change and fatigue.  HEENT: Denies photophobia, eye pain, redness, hearing loss, ear pain, congestion, sore throat, rhinorrhea, sneezing, mouth sores, trouble swallowing, neck pain, neck stiffness and tinnitus.   Respiratory: Denies SOB, DOE, cough, chest tightness,  and wheezing.   Cardiovascular: Denies chest pain, palpitations and leg swelling.  Gastrointestinal: Denies nausea, vomiting, abdominal pain, diarrhea, constipation, blood in stool and abdominal distention.  Genitourinary: Denies dysuria, urgency, frequency, hematuria, flank pain and difficulty urinating.  Endocrine: Denies: hot or cold intolerance, sweats, changes in hair or nails, polyuria, polydipsia. Musculoskeletal: Denies myalgias, back pain, joint swelling, arthralgias and gait problem.  Skin: Denies pallor, rash and wound.  Neurological: Denies dizziness, seizures, syncope, weakness, light-headedness, numbness and headaches.  Hematological: Denies adenopathy. Easy bruising, personal or family bleeding history  Psychiatric/Behavioral: Denies suicidal ideation, mood changes, confusion, nervousness, sleep disturbance and agitation    Physical Exam: Vitals:   02/28/20 1000  BP: 110/80  Pulse: 88  Temp: 97.6 F (36.4 C)  TempSrc: Temporal  SpO2: 99%  Weight: 211 lb 14.4 oz (96.1 kg)    Body mass index is 33.19 kg/m.  Constitutional: NAD, calm, comfortable Eyes: PERRL, lids and conjunctivae normal ENMT: Mucous membranes are moist.   Skin: small area of redness and induration, not fluctuant on the lateral side  of her left breast. Neurologic: grossly intact and non-focal. Psychiatric: Normal judgment and insight. Alert and oriented x 3. Normal mood.    Impression and Plan:  Abscess -Advised warm compresses, not ready for drainage at  present.     Lelon Frohlich, MD [facility] at [facility]

## 2020-03-20 DIAGNOSIS — N760 Acute vaginitis: Secondary | ICD-10-CM | POA: Diagnosis not present

## 2020-03-20 DIAGNOSIS — Z30431 Encounter for routine checking of intrauterine contraceptive device: Secondary | ICD-10-CM | POA: Diagnosis not present

## 2020-06-24 DIAGNOSIS — Z23 Encounter for immunization: Secondary | ICD-10-CM | POA: Diagnosis not present

## 2020-06-25 ENCOUNTER — Encounter: Payer: Self-pay | Admitting: Internal Medicine

## 2020-08-18 ENCOUNTER — Encounter: Payer: Self-pay | Admitting: Internal Medicine

## 2020-08-18 ENCOUNTER — Telehealth: Payer: Self-pay | Admitting: Internal Medicine

## 2020-08-18 NOTE — Telephone Encounter (Signed)
FYI  Note sent to patient in response to Mychart advising patient to schedule an appointment.

## 2020-08-18 NOTE — Telephone Encounter (Signed)
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

## 2020-08-22 ENCOUNTER — Encounter: Payer: Self-pay | Admitting: Family Medicine

## 2020-08-22 ENCOUNTER — Ambulatory Visit: Payer: 59 | Admitting: Family Medicine

## 2020-08-22 ENCOUNTER — Other Ambulatory Visit: Payer: Self-pay

## 2020-08-22 VITALS — BP 110/80 | HR 98 | Temp 98.6°F | Wt 221.0 lb

## 2020-08-22 DIAGNOSIS — R238 Other skin changes: Secondary | ICD-10-CM

## 2020-08-22 DIAGNOSIS — T50B95A Adverse effect of other viral vaccines, initial encounter: Secondary | ICD-10-CM | POA: Diagnosis not present

## 2020-08-22 NOTE — Progress Notes (Signed)
Subjective:    Patient ID: Julia Ruiz, female    DOB: 08/08/1986, 34 y.o.   MRN: [id number]  No chief complaint on file.   HPI Patient is a 34 year old female with past medical history significant for seasonal allergies, history of vitamin D deficiency, obesity who is followed by Dr. Ardyth Harps and seen today for acute concern.  Patient endorses receiving a influenza vaccine 8 days ago at [facility] pharmacy.  Patient notes 2 days after receiving the vaccination she developed a small blister on her right forearm.  She then developed a large blister at the injection site on the same arm.  The blister on her forearm has since dried up.  The blister on right deltoid at site of the injection popped and the skin came off today.  Patient notes the area is slightly pruritic.  Patient denies pain, erythema, increased warmth, other rash, SOB, nausea, vomiting.  Past Medical History:  Diagnosis Date  . Abnormal cervical Papanicolaou smear    as teenager, normal since per her report  . Complication of anesthesia    spinal headache  . Genital herpes   . Gestational diabetes    with first pregnancy   . Obesity (BMI 30.0-34.9)   . Seasonal allergies   . Vitamin D deficiency     Allergies  Allergen Reactions  . Tetracyclines & Related Hives, Shortness Of Breath and Other (See Comments)    wheezing  . Clindagel [Clindamycin] Itching    ROS General: Denies fever, chills, night sweats, changes in weight, changes in appetite HEENT: Denies headaches, ear pain, changes in vision, rhinorrhea, sore throat CV: Denies CP, palpitations, SOB, orthopnea Pulm: Denies SOB, cough, wheezing GI: Denies abdominal pain, nausea, vomiting, diarrhea, constipation GU: Denies dysuria, hematuria, frequency, vaginal discharge Msk: Denies muscle cramps, joint pains Neuro: Denies weakness, numbness, tingling Skin: Denies rashes, bruising  + blisters on R arm Psych: Denies depression, anxiety, hallucinations    Objective:     Blood pressure 110/80, pulse 98, temperature 98.6 F (37 C), temperature source Oral, weight 221 lb (100.2 kg), SpO2 98 %.   Gen. Pleasant, well-nourished, in no distress, normal affect  HEENT: [place]/AT, face symmetric, conjunctiva clear, no scleral icterus, PERRLA, EOMI, nares patent without drainage Lungs: no accessory muscle use Cardiovascular: RRR, no peripheral edema Musculoskeletal: No deformities, no cyanosis or clubbing, normal tone Neuro:  A&Ox3, CN II-XII intact, normal gait Skin:  Warm, dry.   R forearm with a 2 mm hyperpigmented area.  RUE at deltoid with a 2.5 cm area of dermis exposed 2/2 vesicle rupture.   Wt Readings from Last 3 Encounters:  08/22/20 221 lb (100.2 kg)  02/28/20 211 lb 14.4 oz (96.1 kg)  01/16/20 215 lb 4.8 oz (97.7 kg)    Lab Results  Component Value Date   WBC 5.8 01/16/2020   HGB 11.9 (L) 01/16/2020   HCT 37.4 01/16/2020   PLT 308 01/16/2020   GLUCOSE 87 01/16/2020   CHOL 170 03/21/2018   TRIG 78.0 09/02/2016   HDL 46.70 03/21/2018   LDLCALC 96 09/02/2016   ALT 14 01/16/2020   AST 12 (L) 01/16/2020   NA 142 01/16/2020   K 3.5 01/16/2020   CL 106 01/16/2020   CREATININE 0.79 01/16/2020   BUN 8 01/16/2020   CO2 25 01/16/2020   TSH 0.78 03/26/2015   INR 1.0 01/16/2020   HGBA1C 5.1 03/21/2018    Assessment/Plan:  Local reaction to influenza vaccine, initial encounter -vesicular lesions 8 days after influenza vaccine -no  h/o previous rxn to influenza or other vaccines. -discussed supportive care -continue to monitor for development of new vesicles  Vesicle of skin -supportive care -continue to monitor  F/u prn  Abbe Amsterdam, MD

## 2020-08-28 ENCOUNTER — Encounter: Payer: Self-pay | Admitting: Family Medicine

## 2020-08-28 DIAGNOSIS — T50B95A Adverse effect of other viral vaccines, initial encounter: Secondary | ICD-10-CM | POA: Insufficient documentation

## 2020-09-17 DIAGNOSIS — Z6834 Body mass index (BMI) 34.0-34.9, adult: Secondary | ICD-10-CM | POA: Diagnosis not present

## 2020-09-17 DIAGNOSIS — Z3040 Encounter for surveillance of contraceptives, unspecified: Secondary | ICD-10-CM | POA: Diagnosis not present

## 2020-09-17 DIAGNOSIS — Z01419 Encounter for gynecological examination (general) (routine) without abnormal findings: Secondary | ICD-10-CM | POA: Diagnosis not present

## 2020-09-17 DIAGNOSIS — Z124 Encounter for screening for malignant neoplasm of cervix: Secondary | ICD-10-CM | POA: Diagnosis not present

## 2020-09-17 LAB — HM PAP SMEAR

## 2020-09-25 ENCOUNTER — Encounter: Payer: Self-pay | Admitting: Internal Medicine

## 2020-10-28 DIAGNOSIS — Z1152 Encounter for screening for COVID-19: Secondary | ICD-10-CM | POA: Diagnosis not present

## 2020-11-25 DIAGNOSIS — N760 Acute vaginitis: Secondary | ICD-10-CM | POA: Diagnosis not present

## 2020-11-25 DIAGNOSIS — R87615 Unsatisfactory cytologic smear of cervix: Secondary | ICD-10-CM | POA: Diagnosis not present

## 2020-11-25 DIAGNOSIS — Z01419 Encounter for gynecological examination (general) (routine) without abnormal findings: Secondary | ICD-10-CM | POA: Diagnosis not present

## 2020-11-25 DIAGNOSIS — N898 Other specified noninflammatory disorders of vagina: Secondary | ICD-10-CM | POA: Diagnosis not present

## 2020-11-25 DIAGNOSIS — Z124 Encounter for screening for malignant neoplasm of cervix: Secondary | ICD-10-CM | POA: Diagnosis not present

## 2020-11-28 ENCOUNTER — Encounter: Payer: Self-pay | Admitting: Internal Medicine

## 2020-12-22 ENCOUNTER — Other Ambulatory Visit (HOSPITAL_COMMUNITY)
Admission: RE | Admit: 2020-12-22 | Discharge: 2020-12-22 | Disposition: A | Discharge status: Home / Self Care | Payer: 59 | Source: Ambulatory Visit | Attending: Family Medicine | Admitting: Family Medicine

## 2020-12-22 ENCOUNTER — Encounter: Payer: Self-pay | Admitting: Family Medicine

## 2020-12-22 ENCOUNTER — Ambulatory Visit: Payer: 59 | Admitting: Family Medicine

## 2020-12-22 ENCOUNTER — Other Ambulatory Visit: Payer: Self-pay

## 2020-12-22 VITALS — BP 132/98 | HR 91 | Temp 98.8°F | Wt 228.8 lb

## 2020-12-22 DIAGNOSIS — R300 Dysuria: Secondary | ICD-10-CM | POA: Diagnosis not present

## 2020-12-22 DIAGNOSIS — N898 Other specified noninflammatory disorders of vagina: Secondary | ICD-10-CM | POA: Insufficient documentation

## 2020-12-22 LAB — POCT URINALYSIS DIPSTICK
Bilirubin, UA: NEGATIVE
Blood, UA: NEGATIVE
Glucose, UA: NEGATIVE
Ketones, UA: NEGATIVE
Leukocytes, UA: NEGATIVE
Nitrite, UA: NEGATIVE
Protein, UA: POSITIVE — AB
Spec Grav, UA: >=1.03 — AB (ref 1.010–1.025)
Urobilinogen, UA: 0.2 E.U./dL
pH, UA: 6 (ref 5.0–8.0)

## 2020-12-22 NOTE — Patient Instructions (Signed)

## 2020-12-22 NOTE — Progress Notes (Signed)
Subjective:    Patient ID: Julia Ruiz, female    DOB: 1986/03/24, 35 y.o.   MRN: [id number]  Chief Complaint  Patient presents with  . Urinary Frequency    HPI Patient is a 35 year old female with past medical history significant for seasonal allergies, history of gestational diabetes, vitamin D deficiency who is followed by Dr. Ardyth Harps and seen for acute concern.  Pt with dysuria, small amount of white d/c with odor, slight discomfort in low abd x 1 wk.  Pt notes being treated for BV a few wks ago.  Took an at home test that noted leuks but no nitrites.  Pt increased po intake of water and cranberry juice.  Denies n/v, low back pain, changes in soaps, lotions, or detergents.  Paragaurd IUD in place.  Menses due this wk.  Past Medical History:  Diagnosis Date  . Abnormal cervical Papanicolaou smear    as teenager, normal since per her report  . Complication of anesthesia    spinal headache  . Genital herpes   . Gestational diabetes    with first pregnancy   . Obesity (BMI 30.0-34.9)   . Seasonal allergies   . Vitamin D deficiency     Allergies  Allergen Reactions  . Tetracyclines & Related Hives, Shortness Of Breath and Other (See Comments)    wheezing  . Clindagel [Clindamycin] Itching    ROS General: Denies fever, chills, night sweats, changes in weight, changes in appetite HEENT: Denies headaches, ear pain, changes in vision, rhinorrhea, sore throat CV: Denies CP, palpitations, SOB, orthopnea Pulm: Denies SOB, cough, wheezing GI: Denies abdominal pain, nausea, vomiting, diarrhea, constipation GU: Denies hematuria, frequency +dysuria, vaginal discharge, odor Msk: Denies muscle cramps, joint pains Neuro: Denies weakness, numbness, tingling Skin: Denies rashes, bruising Psych: Denies depression, anxiety, hallucinations     Objective:    Blood pressure (!) 132/98, pulse 91, temperature 98.8 F (37.1 C), temperature source Oral, weight 228 lb 12.8 oz (103.8 kg), SpO2  95 %.  Gen. Pleasant, well-nourished, in no distress, normal affect   HEENT: [place]/AT, face symmetric, conjunctiva clear, no scleral icterus, PERRLA, EOMI, nares patent without drainage Lungs: no accessory muscle use Cardiovascular: RRR, no peripheral edema GU: pt self swab obtained Musculoskeletal: No deformities, no cyanosis or clubbing, normal tone Neuro:  A&Ox3, CN II-XII intact, normal gait Skin:  Warm, no lesions/ rash   Wt Readings from Last 3 Encounters:  12/22/20 228 lb 12.8 oz (103.8 kg)  08/22/20 221 lb (100.2 kg)  02/28/20 211 lb 14.4 oz (96.1 kg)    Lab Results  Component Value Date   WBC 5.8 01/16/2020   HGB 11.9 (L) 01/16/2020   HCT 37.4 01/16/2020   PLT 308 01/16/2020   GLUCOSE 87 01/16/2020   CHOL 170 03/21/2018   TRIG 78.0 09/02/2016   HDL 46.70 03/21/2018   LDLCALC 96 09/02/2016   ALT 14 01/16/2020   AST 12 (L) 01/16/2020   NA 142 01/16/2020   K 3.5 01/16/2020   CL 106 01/16/2020   CREATININE 0.79 01/16/2020   BUN 8 01/16/2020   CO2 25 01/16/2020   TSH 0.78 03/26/2015   INR 1.0 01/16/2020   HGBA1C 5.1 03/21/2018    Assessment/Plan:  Dysuria  -continue supportive care including increasing po hydration -will send in rx if needed based on cx and aptima swab results. - Plan: POCT urinalysis dipstick, Cervicovaginal ancillary only, Urine Culture  Vaginal discharge -Discussed possible causes including, BV, trichomoniasis, also consider physiologic d/c 2/2 IUD in  place -Continue supportive care  - Plan: Cervicovaginal ancillary only  F/u prn  Abbe Amsterdam, MD

## 2020-12-23 LAB — CERVICOVAGINAL ANCILLARY ONLY
Bacterial Vaginitis (gardnerella): NEGATIVE
Candida Glabrata: NEGATIVE
Candida Vaginitis: NEGATIVE
Chlamydia: NEGATIVE
Comment: NEGATIVE
Comment: NEGATIVE
Comment: NEGATIVE
Comment: NEGATIVE
Comment: NEGATIVE
Comment: NORMAL
Neisseria Gonorrhea: NEGATIVE
Trichomonas: NEGATIVE

## 2020-12-30 NOTE — Progress Notes (Signed)
Results viewed on MyChart. 

## 2021-06-01 ENCOUNTER — Encounter (HOSPITAL_COMMUNITY): Payer: Self-pay

## 2021-06-01 ENCOUNTER — Other Ambulatory Visit: Payer: Self-pay

## 2021-06-01 ENCOUNTER — Ambulatory Visit (HOSPITAL_COMMUNITY)
Admission: EM | Admit: 2021-06-01 | Discharge: 2021-06-01 | Disposition: A | Discharge status: Home / Self Care | Payer: 59 | Attending: Family Medicine | Admitting: Family Medicine

## 2021-06-01 DIAGNOSIS — R300 Dysuria: Secondary | ICD-10-CM | POA: Insufficient documentation

## 2021-06-01 DIAGNOSIS — Z113 Encounter for screening for infections with a predominantly sexual mode of transmission: Secondary | ICD-10-CM | POA: Diagnosis not present

## 2021-06-01 DIAGNOSIS — N3090 Cystitis, unspecified without hematuria: Secondary | ICD-10-CM | POA: Diagnosis not present

## 2021-06-01 LAB — POCT URINALYSIS DIPSTICK, ED / UC
Bilirubin Urine: NEGATIVE
Glucose, UA: NEGATIVE mg/dL
Hgb urine dipstick: NEGATIVE
Ketones, ur: NEGATIVE mg/dL
Nitrite: NEGATIVE
Protein, ur: NEGATIVE mg/dL
Specific Gravity, Urine: 1.02 (ref 1.005–1.030)
Urobilinogen, UA: 0.2 mg/dL (ref 0.0–1.0)
pH: 5.5 (ref 5.0–8.0)

## 2021-06-01 MED ORDER — CEPHALEXIN 500 MG PO CAPS: 500.0000 mg | ORAL_CAPSULE | Freq: Two times a day (BID) | ORAL | 0 refills | Status: DC

## 2021-06-01 NOTE — Discharge Instructions (Addendum)
You have had labs (urine culture and vaginal cytology) sent today. We will call you with any significant abnormalities or if there is need to begin or change treatment or pursue further follow up.  You may also review your test results online through MyChart. If you do not have a MyChart account, instructions to sign up should be on your discharge paperwork.  

## 2021-06-01 NOTE — ED Triage Notes (Addendum)
Patient c/o burning when urinating, denies other symptoms. Pt requesting to be tested for BV Started: about 7 days ago

## 2021-06-01 NOTE — ED Provider Notes (Signed)
[facility]   [id number] 06/01/21 Arrival Time: 1157  ASSESSMENT & PLAN:  1. Dysuria   2. Cystitis   3. Screen for STD (sexually transmitted disease)    Begin: Meds ordered this encounter  Medications   cephALEXin (KEFLEX) 500 MG capsule    Sig: Take 1 capsule (500 mg total) by mouth 2 (two) times daily.    Dispense:  10 capsule    Refill:  0      Discharge Instructions      You have had labs (urine culture and vaginal cytology) sent today. We will call you with any significant abnormalities or if there is need to begin or change treatment or pursue further follow up.  You may also review your test results online through MyChart. If you do not have a MyChart account, instructions to sign up should be on your discharge paperwork.     Without s/s of PID.  Labs Reviewed  POCT URINALYSIS DIPSTICK, ED / UC - Abnormal; Notable for the following components:      Result Value   Leukocytes,Ua SMALL (*)    All other components within normal limits  URINE CULTURE  CERVICOVAGINAL ANCILLARY ONLY   Will notify of any positive results. Instructed to refrain from sexual activity for at least seven days.  Reviewed expectations re: course of current medical issues. Questions answered. Outlined signs and symptoms indicating need for more acute intervention. Patient verbalized understanding. After Visit Summary given.   SUBJECTIVE:  Julia Ruiz is a 35 y.o. female who presents with urinary freq/dysuria; couple of days; question vag irritation; "May be yeast or BV'. Desires testing. No specific aggravating or alleviating factors reported. Denies: gross hematuria. Afebrile. No abdominal or pelvic pain. Normal PO intake wihout n/v. No genital rashes or lesions. OTC treatment: none.  Patient's last menstrual period was 05/24/2021.   OBJECTIVE:  Vitals:   06/01/21 1333  BP: 133/83  Pulse: 76  Resp: 18  Temp: 98 F (36.7 C)  SpO2: 98%     General appearance:  alert, cooperative, appears stated age and no distress Lungs: unlabored respirations; speaks full sentences without difficulty Back: no CVA tenderness; FROM at waist Abdomen: soft, non-tender GU: deferred Skin: warm and dry Psychological: alert and cooperative; normal mood and affect.  Results for orders placed or performed during the hospital encounter of 06/01/21  POC Urinalysis dipstick  Result Value Ref Range   Glucose, UA NEGATIVE NEGATIVE mg/dL   Bilirubin Urine NEGATIVE NEGATIVE   Ketones, ur NEGATIVE NEGATIVE mg/dL   Specific Gravity, Urine 1.020 1.005 - 1.030   Hgb urine dipstick NEGATIVE NEGATIVE   pH 5.5 5.0 - 8.0   Protein, ur NEGATIVE NEGATIVE mg/dL   Urobilinogen, UA 0.2 0.0 - 1.0 mg/dL   Nitrite NEGATIVE NEGATIVE   Leukocytes,Ua SMALL (A) NEGATIVE    Labs Reviewed  POCT URINALYSIS DIPSTICK, ED / UC - Abnormal; Notable for the following components:      Result Value   Leukocytes,Ua SMALL (*)    All other components within normal limits  URINE CULTURE  CERVICOVAGINAL ANCILLARY ONLY    Allergies  Allergen Reactions   Tetracyclines & Related Hives, Shortness Of Breath and Other (See Comments)    wheezing   Clindagel [Clindamycin] Itching    Past Medical History:  Diagnosis Date   Abnormal cervical Papanicolaou smear    as teenager, normal since per her report   Complication of anesthesia    spinal headache   Genital herpes  Gestational diabetes    with first pregnancy    Obesity (BMI 30.0-34.9)    Seasonal allergies    Vitamin D deficiency    Family History  Problem Relation Age of Onset   Mental illness Brother    Irritable bowel syndrome Mother    Social History   Socioeconomic History   Marital status: Married    Spouse name: Not on file   Number of children: Not on file   Years of education: Not on file   Highest education level: Not on file  Occupational History   Not on file  Tobacco Use   Smoking status: Never   Smokeless  tobacco: Never  Vaping Use   Vaping Use: Never used  Substance and Sexual Activity   Alcohol use: Yes    Comment: occasional   Drug use: No   Sexual activity: Yes  Other Topics Concern   Not on file  Social History Narrative   ** Merged History Encounter **       Work or School: Airline pilot - registration      Home Situation: lives husband and 2 children      Spiritual Beliefs: Christian      Lifestyle: no regular exercise; diet is good   Social Determinants of Corporate investment banker Strain: Not on file  Food Insecurity: Not on file  Transportation Needs: Not on file  Physical Activity: Not on file  Stress: Not on file  Social Connections: Not on file  Intimate Partner Violence: Not on file           [place], MD 06/01/21 [phone]

## 2021-06-02 LAB — CERVICOVAGINAL ANCILLARY ONLY
Bacterial Vaginitis (gardnerella): NEGATIVE
Candida Glabrata: NEGATIVE
Candida Vaginitis: NEGATIVE
Chlamydia: NEGATIVE
Comment: NEGATIVE
Comment: NEGATIVE
Comment: NEGATIVE
Comment: NEGATIVE
Comment: NEGATIVE
Comment: NORMAL
Neisseria Gonorrhea: NEGATIVE
Trichomonas: NEGATIVE

## 2021-06-03 LAB — URINE CULTURE: Culture: >=100000 — AB

## 2021-08-06 ENCOUNTER — Other Ambulatory Visit: Payer: Self-pay

## 2021-08-06 ENCOUNTER — Encounter: Payer: Self-pay | Admitting: Family Medicine

## 2021-08-06 ENCOUNTER — Ambulatory Visit: Payer: 59 | Admitting: Family Medicine

## 2021-08-06 ENCOUNTER — Other Ambulatory Visit (HOSPITAL_COMMUNITY)
Admission: RE | Admit: 2021-08-06 | Discharge: 2021-08-06 | Disposition: A | Discharge status: Home / Self Care | Payer: 59 | Source: Ambulatory Visit | Attending: Family Medicine | Admitting: Family Medicine

## 2021-08-06 VITALS — BP 116/80 | HR 90 | Temp 98.2°F | Ht 67.0 in | Wt 228.6 lb

## 2021-08-06 DIAGNOSIS — N760 Acute vaginitis: Secondary | ICD-10-CM

## 2021-08-06 DIAGNOSIS — Z23 Encounter for immunization: Secondary | ICD-10-CM

## 2021-08-06 LAB — POCT URINALYSIS DIPSTICK
Spec Grav, UA: 1.025 (ref 1.010–1.025)
pH, UA: 6 (ref 5.0–8.0)

## 2021-08-06 LAB — POCT URINE PREGNANCY: Preg Test, Ur: NEGATIVE

## 2021-08-06 MED ORDER — FLUCONAZOLE 150 MG PO TABS: 150.0000 mg | ORAL_TABLET | Freq: Once | ORAL | 0 refills | Status: AC

## 2021-08-06 NOTE — Progress Notes (Signed)
Subjective:    Patient ID: Julia Ruiz, female    DOB: [birth year], 35 y.o.   MRN: [id number]  Chief Complaint  Patient presents with   Vaginitis    Boil inside upper thigh    HPI Patient was seen today for acute concern.  Pt with vaginal d/c, slight odor, irritation times few days.  Inquires about pregnancy test, though does not think she is pregnant.  Patient notes menses may start at the beginning of the month and again at the end of the month.  Patient also notes recent boil on inside of her right thigh that has since resolved.  Patient does endorse change in soap over a month ago.  Denies nausea, vomiting, constipation, suprapubic pain, low back pain, fever, chills.  Past Medical History:  Diagnosis Date   Abnormal cervical Papanicolaou smear    as teenager, normal since per her report   Complication of anesthesia    spinal headache   Genital herpes    Gestational diabetes    with first pregnancy    Obesity (BMI 30.0-34.9)    Seasonal allergies    Vitamin D deficiency     Allergies  Allergen Reactions   Tetracyclines & Related Hives, Shortness Of Breath and Other (See Comments)    wheezing   Clindagel [Clindamycin] Itching    ROS General: Denies fever, chills, night sweats, changes in weight, changes in appetite HEENT: Denies headaches, ear pain, changes in vision, rhinorrhea, sore throat CV: Denies CP, palpitations, SOB, orthopnea Pulm: Denies SOB, cough, wheezing GI: Denies abdominal pain, nausea, vomiting, diarrhea, constipation GU: Denies dysuria, hematuria, frequency + vaginal discharge, irritation Msk: Denies muscle cramps, joint pains Neuro: Denies weakness, numbness, tingling Skin: Denies rashes, bruising Psych: Denies depression, anxiety, hallucinations     Objective:    Blood pressure 116/80, pulse 90, temperature 98.2 F (36.8 C), temperature source Oral, height 5\' 7"  (1.702 m), weight 228 lb 9.6 oz (103.7 kg), last menstrual period 07/14/2021, SpO2 97  %.  Gen. Pleasant, well-nourished, in no distress, normal affect   HEENT: [place]/AT, face symmetric, conjunctiva clear, no scleral icterus, PERRLA, EOMI, nares patent without drainage Lungs: no accessory muscle use Cardiovascular: RRR, no peripheral edema  GU: Aptima Self swab obtained Musculoskeletal: No deformities, no cyanosis or clubbing, normal tone Neuro:  A&Ox3, CN II-XII intact, normal gait Skin:  Warm, no lesions/ rash   Wt Readings from Last 3 Encounters:  08/06/21 228 lb 9.6 oz (103.7 kg)  12/22/20 228 lb 12.8 oz (103.8 kg)  08/22/20 221 lb (100.2 kg)    Lab Results  Component Value Date   WBC 5.8 01/16/2020   HGB 11.9 (L) 01/16/2020   HCT 37.4 01/16/2020   PLT 308 01/16/2020   GLUCOSE 87 01/16/2020   CHOL 170 03/21/2018   TRIG 78.0 09/02/2016   HDL 46.70 03/21/2018   LDLCALC 96 09/02/2016   ALT 14 01/16/2020   AST 12 (L) 01/16/2020   NA 142 01/16/2020   K 3.5 01/16/2020   CL 106 01/16/2020   CREATININE 0.79 01/16/2020   BUN 8 01/16/2020   CO2 25 01/16/2020   TSH 0.78 03/26/2015   INR 1.0 01/16/2020   HGBA1C 5.1 03/21/2018    Assessment/Plan:  Acute vaginitis  -Aptima self swab collected -UA negative for UTI -will start diflucan  -if needed will send in further meds based on Aptima results -UhCG negative. - Plan: Cervicovaginal ancillary only, fluconazole (DIFLUCAN) 150 MG tablet, POCT urine pregnancy  Need for immunization against influenza -Influenza  vaccine given this visit  F/u as needed  Abbe Amsterdam, MD

## 2021-08-10 LAB — CERVICOVAGINAL ANCILLARY ONLY
Bacterial Vaginitis (gardnerella): NEGATIVE
Candida Glabrata: NEGATIVE
Candida Vaginitis: POSITIVE — AB
Chlamydia: NEGATIVE
Comment: NEGATIVE
Comment: NEGATIVE
Comment: NEGATIVE
Comment: NEGATIVE
Comment: NEGATIVE
Comment: NORMAL
Neisseria Gonorrhea: NEGATIVE
Trichomonas: NEGATIVE

## 2021-08-14 ENCOUNTER — Encounter: Payer: Self-pay | Admitting: Internal Medicine

## 2021-08-14 ENCOUNTER — Encounter: Payer: Self-pay | Admitting: Family Medicine

## 2021-08-19 ENCOUNTER — Encounter: Payer: Self-pay | Admitting: Family Medicine

## 2021-08-19 ENCOUNTER — Telehealth: Payer: Self-pay

## 2021-08-19 NOTE — Telephone Encounter (Signed)
Patient called requesting [place] registry for immunizations and Avs that shows were she had her flu shot. Patient is on her way to pick up.

## 2021-08-20 ENCOUNTER — Ambulatory Visit: Payer: 59 | Admitting: Internal Medicine

## 2021-08-27 ENCOUNTER — Ambulatory Visit (INDEPENDENT_AMBULATORY_CARE_PROVIDER_SITE_OTHER): Payer: 59 | Admitting: Internal Medicine

## 2021-08-27 DIAGNOSIS — E669 Obesity, unspecified: Secondary | ICD-10-CM

## 2021-08-27 DIAGNOSIS — Z23 Encounter for immunization: Secondary | ICD-10-CM | POA: Diagnosis not present

## 2021-08-27 DIAGNOSIS — Z0000 Encounter for general adult medical examination without abnormal findings: Secondary | ICD-10-CM

## 2021-08-27 DIAGNOSIS — M6208 Separation of muscle (nontraumatic), other site: Secondary | ICD-10-CM | POA: Diagnosis not present

## 2021-08-27 DIAGNOSIS — E559 Vitamin D deficiency, unspecified: Secondary | ICD-10-CM | POA: Diagnosis not present

## 2021-08-27 LAB — LIPID PANEL
Cholesterol: 191 mg/dL (ref 0–200)
HDL: 47.3 mg/dL (ref >39.00)
LDL Cholesterol: 125 mg/dL — ABNORMAL HIGH (ref 0–99)
NonHDL: 143.36
Total CHOL/HDL Ratio: 4
Triglycerides: 93 mg/dL (ref 0.0–149.0)
VLDL: 18.6 mg/dL (ref 0.0–40.0)

## 2021-08-27 LAB — COMPREHENSIVE METABOLIC PANEL
ALT: 23 U/L (ref 0–35)
AST: 22 U/L (ref 0–37)
Albumin: 4.4 g/dL (ref 3.5–5.2)
Alkaline Phosphatase: 51 U/L (ref 39–117)
BUN: 7 mg/dL (ref 6–23)
CO2: 26 mEq/L (ref 19–32)
Calcium: 9.1 mg/dL (ref 8.4–10.5)
Chloride: 104 mEq/L (ref 96–112)
Creatinine, Ser: 0.62 mg/dL (ref 0.40–1.20)
GFR: 114.76 mL/min (ref >60.00)
Glucose, Bld: 87 mg/dL (ref 70–99)
Potassium: 3.8 mEq/L (ref 3.5–5.1)
Sodium: 137 mEq/L (ref 135–145)
Total Bilirubin: 1.1 mg/dL (ref 0.2–1.2)
Total Protein: 7.6 g/dL (ref 6.0–8.3)

## 2021-08-27 LAB — CBC WITH DIFFERENTIAL/PLATELET
Basophils Absolute: 0 10*3/uL (ref 0.0–0.1)
Basophils Relative: 0.4 % (ref 0.0–3.0)
Eosinophils Absolute: 0.3 10*3/uL (ref 0.0–0.7)
Eosinophils Relative: 4.5 % (ref 0.0–5.0)
HCT: 36.3 % (ref 36.0–46.0)
Hemoglobin: 11.9 g/dL — ABNORMAL LOW (ref 12.0–15.0)
Lymphocytes Relative: 38.9 % (ref 12.0–46.0)
Lymphs Abs: 2.2 10*3/uL (ref 0.7–4.0)
MCHC: 32.6 g/dL (ref 30.0–36.0)
MCV: 83.5 fl (ref 78.0–100.0)
Monocytes Absolute: 0.4 10*3/uL (ref 0.1–1.0)
Monocytes Relative: 7.6 % (ref 3.0–12.0)
Neutro Abs: 2.7 10*3/uL (ref 1.4–7.7)
Neutrophils Relative %: 48.6 % (ref 43.0–77.0)
Platelets: 295 10*3/uL (ref 150.0–400.0)
RBC: 4.35 Mil/uL (ref 3.87–5.11)
RDW: 14.2 % (ref 11.5–15.5)
WBC: 5.6 10*3/uL (ref 4.0–10.5)

## 2021-08-27 LAB — TSH: TSH: 0.56 u[IU]/mL (ref 0.35–5.50)

## 2021-08-27 LAB — VITAMIN D 25 HYDROXY (VIT D DEFICIENCY, FRACTURES): VITD: 25.57 ng/mL — ABNORMAL LOW (ref 30.00–100.00)

## 2021-08-27 LAB — HEMOGLOBIN A1C: Hgb A1c MFr Bld: 5.5 % (ref 4.6–6.5)

## 2021-08-27 NOTE — Progress Notes (Signed)
Established Patient Office Visit     This visit occurred during the SARS-CoV-2 public health emergency.  Safety protocols were in place, including screening questions prior to the visit, additional usage of staff PPE, and extensive cleaning of exam room while observing appropriate contact time as indicated for disinfecting solutions.    CC/Reason for Visit: Annual preventive exam  HPI: Julia Ruiz is a 35 y.o. female who is coming in today for the above mentioned reasons. Past Medical History is significant for: Vitamin D deficiency, obesity, history of genital herpes.  She feels well and has no acute concerns today.  She did develop diastases of rectus abdominal muscles after her multiple childbirths.  It is starting to cause some mild pain.  She has had her flu vaccine, she is requesting HPV vaccine.  She has a GYN with whom she follows routinely.  She has not yet had her COVID vaccinations.   Past Medical/Surgical History: Past Medical History:  Diagnosis Date   Abnormal cervical Papanicolaou smear    as teenager, normal since per her report   Complication of anesthesia    spinal headache   Genital herpes    Gestational diabetes    with first pregnancy    Obesity (BMI 30.0-34.9)    Seasonal allergies    Vitamin D deficiency     Past Surgical History:  Procedure Laterality Date   CESAREAN SECTION     CESAREAN SECTION     CESAREAN SECTION N/A 06/29/2017   Procedure: REPEAT CESAREAN SECTION;  Surgeon: Everett Graff, MD;  Location: [place];  Service: Obstetrics;  Laterality: N/A;  Tracey RNFA    Social History:  reports that she has never smoked. She has never used smokeless tobacco. She reports current alcohol use. She reports that she does not use drugs.  Allergies: Allergies  Allergen Reactions   Tetracyclines & Related Hives, Shortness Of Breath and Other (See Comments)    wheezing   Clindagel [Clindamycin] Itching    Family History:  Family  History  Problem Relation Age of Onset   Mental illness Brother    Irritable bowel syndrome Mother      Current Outpatient Medications:    Multiple Vitamin (MULTIVITAMIN WITH MINERALS) TABS tablet, Take 1 tablet by mouth daily., Disp: , Rfl:    cephALEXin (KEFLEX) 500 MG capsule, Take 1 capsule (500 mg total) by mouth 2 (two) times daily., Disp: 10 capsule, Rfl: 0   Ergocalciferol (VITAMIN D2 PO), Take by mouth. (Patient not taking: No sig reported), Disp: , Rfl:   Review of Systems:  Constitutional: Denies fever, chills, diaphoresis, appetite change and fatigue.  HEENT: Denies photophobia, eye pain, redness, hearing loss, ear pain, congestion, sore throat, rhinorrhea, sneezing, mouth sores, trouble swallowing, neck pain, neck stiffness and tinnitus.   Respiratory: Denies SOB, DOE, cough, chest tightness,  and wheezing.   Cardiovascular: Denies chest pain, palpitations and leg swelling.  Gastrointestinal: Denies nausea, vomiting, abdominal pain, diarrhea, constipation, blood in stool and abdominal distention.  Genitourinary: Denies dysuria, urgency, frequency, hematuria, flank pain and difficulty urinating.  Endocrine: Denies: hot or cold intolerance, sweats, changes in hair or nails, polyuria, polydipsia. Musculoskeletal: Denies myalgias, back pain, joint swelling, arthralgias and gait problem.  Skin: Denies pallor, rash and wound.  Neurological: Denies dizziness, seizures, syncope, weakness, light-headedness, numbness and headaches.  Hematological: Denies adenopathy. Easy bruising, personal or family bleeding history  Psychiatric/Behavioral: Denies suicidal ideation, mood changes, confusion, nervousness, sleep disturbance and agitation    Physical  Exam: There were no vitals filed for this visit.  There is no height or weight on file to calculate BMI.   Constitutional: NAD, calm, comfortable Eyes: PERRL, lids and conjunctivae normal ENMT: Mucous membranes are moist. Posterior  pharynx clear of any exudate or lesions. Normal dentition. Tympanic membrane is pearly white, no erythema or bulging. Neck: normal, supple, no masses, no thyromegaly Respiratory: clear to auscultation bilaterally, no wheezing, no crackles. Normal respiratory effort. No accessory muscle use.  Cardiovascular: Regular rate and rhythm, no murmurs / rubs / gallops. No extremity edema. 2+ pedal pulses. No carotid bruits.  Abdomen: no tenderness, no masses palpated. No hepatosplenomegaly. Bowel sounds positive.  Musculoskeletal: no clubbing / cyanosis. No joint deformity upper and lower extremities. Good ROM, no contractures. Normal muscle tone.  Skin: no rashes, lesions, ulcers. No induration Neurologic: CN 2-12 grossly intact. Sensation intact, DTR normal. Strength 5/5 in all 4.  Psychiatric: Normal judgment and insight. Alert and oriented x 3. Normal mood.    Impression and Plan:  Encounter for preventive health examination -Recommend routine eye and dental care. -Immunizations: HPV today, she is due COVID vaccines but declines despite counseling -Healthy lifestyle discussed in detail. -Labs to be updated today. -Colon cancer screening: Commence age 50 -Breast cancer screening: Commence age 38 -Cervical cancer screening: December/2021 -Lung cancer screening: Not applicable -Prostate cancer screening: Not applicable -DEXA: Not applicable  Diastasis of rectus abdominis -If starting to cause pain, she will touch base with her plastic surgeon to see about potential repair.  Obesity (BMI 30.0-34.9)  - Plan: CBC with Differential/Platelet, Comprehensive metabolic panel, Lipid panel, TSH, Hemoglobin A1c -Discussed healthy lifestyle, including increased physical activity and better food choices to promote weight loss.  Vitamin D deficiency  - Plan: VITAMIN D 25 Hydroxy (Vit-D Deficiency, Fractures)  Need for HPV vaccination -First vaccine given today.    Patient Instructions  -Nice  seeing you today!!  -Lab work today; will notify you once results are available.  -HPV vaccine.  -Schedule follow up in 1 year or sooner as needed.      Chaya Jan, MD [facility]

## 2021-08-27 NOTE — Addendum Note (Signed)
Addended by: Kandra Nicolas on: 08/27/2021 01:48 PM   Modules accepted: Orders

## 2021-08-27 NOTE — Patient Instructions (Signed)
-  Nice seeing you today!!  -Lab work today; will notify you once results are available.  -HPV vaccine.  -Schedule follow up in 1 year or sooner as needed.

## 2021-08-27 NOTE — Addendum Note (Signed)
Addended by: Kern Reap B on: 08/27/2021 01:54 PM   Modules accepted: Orders

## 2021-08-28 ENCOUNTER — Other Ambulatory Visit: Payer: Self-pay | Admitting: Internal Medicine

## 2021-08-28 ENCOUNTER — Encounter: Payer: Self-pay | Admitting: Internal Medicine

## 2021-08-28 DIAGNOSIS — E785 Hyperlipidemia, unspecified: Secondary | ICD-10-CM | POA: Insufficient documentation

## 2021-08-28 DIAGNOSIS — E559 Vitamin D deficiency, unspecified: Secondary | ICD-10-CM

## 2021-08-28 MED ORDER — VITAMIN D (ERGOCALCIFEROL) 1.25 MG (50000 UNIT) PO CAPS: 50000.0000 [IU] | ORAL_CAPSULE | ORAL | 0 refills | Status: AC

## 2021-10-15 ENCOUNTER — Encounter: Payer: Self-pay | Admitting: Internal Medicine

## 2021-10-15 DIAGNOSIS — E669 Obesity, unspecified: Secondary | ICD-10-CM

## 2021-10-29 ENCOUNTER — Encounter: Payer: Self-pay | Admitting: Internal Medicine

## 2021-10-29 ENCOUNTER — Ambulatory Visit: Payer: 59 | Admitting: Internal Medicine

## 2021-10-29 VITALS — BP 120/70 | HR 69 | Temp 97.8°F | Ht 67.0 in | Wt 227.1 lb

## 2021-10-29 DIAGNOSIS — B3731 Acute candidiasis of vulva and vagina: Secondary | ICD-10-CM

## 2021-10-29 MED ORDER — FLUCONAZOLE 150 MG PO TABS: 150.0000 mg | ORAL_TABLET | Freq: Every day | ORAL | 0 refills | Status: DC

## 2021-10-29 NOTE — Progress Notes (Signed)
Acute office Visit     This visit occurred during the SARS-CoV-2 public health emergency.  Safety protocols were in place, including screening questions prior to the visit, additional usage of staff PPE, and extensive cleaning of exam room while observing appropriate contact time as indicated for disinfecting solutions.    CC/Reason for Visit: Vaginal itching  HPI: Emanie Ventrone is a 36 y.o. female who is coming in today for the above mentioned reasons.  For the past 3 days she has noticed vaginal itching, redness and a thick white discharge.  She recognizes this as a yeast infection.  Past Medical/Surgical History: Past Medical History:  Diagnosis Date   Abnormal cervical Papanicolaou smear    as teenager, normal since per her report   Complication of anesthesia    spinal headache   Genital herpes    Gestational diabetes    with first pregnancy    Obesity (BMI 30.0-34.9)    Seasonal allergies    Vitamin D deficiency     Past Surgical History:  Procedure Laterality Date   CESAREAN SECTION     CESAREAN SECTION     CESAREAN SECTION N/A 06/29/2017   Procedure: REPEAT CESAREAN SECTION;  Surgeon: Everett Graff, MD;  Location: [place];  Service: Obstetrics;  Laterality: N/A;  Tracey RNFA    Social History:  reports that she has never smoked. She has never used smokeless tobacco. She reports current alcohol use. She reports that she does not use drugs.  Allergies: Allergies  Allergen Reactions   Tetracyclines & Related Hives, Shortness Of Breath and Other (See Comments)    wheezing   Clindagel [Clindamycin] Itching    Family History:  Family History  Problem Relation Age of Onset   Mental illness Brother    Irritable bowel syndrome Mother      Current Outpatient Medications:    fluconazole (DIFLUCAN) 150 MG tablet, Take 1 tablet (150 mg total) by mouth daily., Disp: 3 tablet, Rfl: 0   Multiple Vitamin (MULTIVITAMIN WITH MINERALS) TABS tablet, Take 1  tablet by mouth daily., Disp: , Rfl:    Vitamin D, Ergocalciferol, (DRISDOL) 1.25 MG (50000 UNIT) CAPS capsule, Take 1 capsule (50,000 Units total) by mouth every 7 (seven) days for 12 doses., Disp: 12 capsule, Rfl: 0  Review of Systems:  Constitutional: Denies fever, chills, diaphoresis, appetite change and fatigue.  HEENT: Denies photophobia, eye pain, redness, hearing loss, ear pain, congestion, sore throat, rhinorrhea, sneezing, mouth sores, trouble swallowing, neck pain, neck stiffness and tinnitus.   Respiratory: Denies SOB, DOE, cough, chest tightness,  and wheezing.   Cardiovascular: Denies chest pain, palpitations and leg swelling.  Gastrointestinal: Denies nausea, vomiting, abdominal pain, diarrhea, constipation, blood in stool and abdominal distention.  Genitourinary: Denies dysuria, urgency, frequency, hematuria, flank pain and difficulty urinating.  Endocrine: Denies: hot or cold intolerance, sweats, changes in hair or nails, polyuria, polydipsia. Musculoskeletal: Denies myalgias, back pain, joint swelling, arthralgias and gait problem.  Skin: Denies pallor, rash and wound.  Neurological: Denies dizziness, seizures, syncope, weakness, light-headedness, numbness and headaches.  Hematological: Denies adenopathy. Easy bruising, personal or family bleeding history  Psychiatric/Behavioral: Denies suicidal ideation, mood changes, confusion, nervousness, sleep disturbance and agitation    Physical Exam: Vitals:   10/29/21 0700  BP: 120/70  Pulse: 69  Temp: 97.8 F (36.6 C)  TempSrc: Oral  SpO2: 99%  Weight: 227 lb 1 oz (103 kg)  Height: 5\' 7"  (1.702 m)    Body mass index is  35.56 kg/m.   Constitutional: NAD, calm, comfortable Eyes: PERRL, lids and conjunctivae normal ENMT: Mucous membranes are moist.  Neurologic: Grossly intact and nonfocal Psychiatric: Normal judgment and insight. Alert and oriented x 3. Normal mood.    Impression and Plan:  Vaginal yeast infection   - Plan: fluconazole (DIFLUCAN) 150 MG tablet  Time spent: 20 minutes reviewing chart, interviewing and examining patient and formulating plan of care.   Patient Instructions  -Nice seeing you today!!  -Diflucan 150 mg daily for 3 days.    Lelon Frohlich, MD [facility]

## 2021-10-29 NOTE — Patient Instructions (Signed)
-  Nice seeing you today!!  -Diflucan 150 mg daily for 3 days.

## 2021-11-19 ENCOUNTER — Encounter: Payer: Self-pay | Admitting: Internal Medicine

## 2021-11-19 ENCOUNTER — Ambulatory Visit: Payer: 59 | Admitting: Internal Medicine

## 2021-11-19 ENCOUNTER — Other Ambulatory Visit (HOSPITAL_COMMUNITY)
Admission: RE | Admit: 2021-11-19 | Discharge: 2021-11-19 | Disposition: A | Discharge status: Home / Self Care | Payer: 59 | Source: Ambulatory Visit | Attending: Internal Medicine | Admitting: Internal Medicine

## 2021-11-19 VITALS — BP 110/80 | HR 103 | Temp 98.4°F | Wt 225.1 lb

## 2021-11-19 DIAGNOSIS — N898 Other specified noninflammatory disorders of vagina: Secondary | ICD-10-CM

## 2021-11-19 DIAGNOSIS — B3731 Acute candidiasis of vulva and vagina: Secondary | ICD-10-CM

## 2021-11-19 MED ORDER — FLUCONAZOLE 150 MG PO TABS: 150.0000 mg | ORAL_TABLET | Freq: Every day | ORAL | 0 refills | Status: AC

## 2021-11-19 NOTE — Progress Notes (Signed)
Established Patient Office Visit     This visit occurred during the SARS-CoV-2 public health emergency.  Safety protocols were in place, including screening questions prior to the visit, additional usage of staff PPE, and extensive cleaning of exam room while observing appropriate contact time as indicated for disinfecting solutions.    CC/Reason for Visit: Vaginal discharge and itching  HPI: Julia Ruiz is a 36 y.o. female who is coming in today for the above mentioned reasons.  She was treated empirically 2 weeks ago for vaginal yeast with fluconazole.  She states her symptoms improved but it quickly returned.  Review of the chart indicates that she was treated for vaginal yeast in October, August and March of last year all confirmed by wet prep.  Past Medical/Surgical History: Past Medical History:  Diagnosis Date   Abnormal cervical Papanicolaou smear    as teenager, normal since per her report   Complication of anesthesia    spinal headache   Genital herpes    Gestational diabetes    with first pregnancy    Obesity (BMI 30.0-34.9)    Seasonal allergies    Vitamin D deficiency     Past Surgical History:  Procedure Laterality Date   CESAREAN SECTION     CESAREAN SECTION     CESAREAN SECTION N/A 06/29/2017   Procedure: REPEAT CESAREAN SECTION;  Surgeon: Everett Graff, MD;  Location: [place];  Service: Obstetrics;  Laterality: N/A;  Tracey RNFA    Social History:  reports that she has never smoked. She has never used smokeless tobacco. She reports current alcohol use. She reports that she does not use drugs.  Allergies: Allergies  Allergen Reactions   Tetracyclines & Related Hives, Shortness Of Breath and Other (See Comments)    wheezing   Clindagel [Clindamycin] Itching    Family History:  Family History  Problem Relation Age of Onset   Mental illness Brother    Irritable bowel syndrome Mother      Current Outpatient Medications:     fluconazole (DIFLUCAN) 150 MG tablet, Take 1 tablet (150 mg total) by mouth daily for 3 doses., Disp: 3 tablet, Rfl: 0   Multiple Vitamin (MULTIVITAMIN WITH MINERALS) TABS tablet, Take 1 tablet by mouth daily., Disp: , Rfl:   Review of Systems:  Constitutional: Denies fever, chills, diaphoresis, appetite change and fatigue.  HEENT: Denies photophobia, eye pain, redness, hearing loss, ear pain, congestion, sore throat, rhinorrhea, sneezing, mouth sores, trouble swallowing, neck pain, neck stiffness and tinnitus.   Respiratory: Denies SOB, DOE, cough, chest tightness,  and wheezing.   Cardiovascular: Denies chest pain, palpitations and leg swelling.  Gastrointestinal: Denies nausea, vomiting, abdominal pain, diarrhea, constipation, blood in stool and abdominal distention.  Genitourinary: Denies dysuria, urgency, frequency, hematuria, flank pain and difficulty urinating.  Endocrine: Denies: hot or cold intolerance, sweats, changes in hair or nails, polyuria, polydipsia. Musculoskeletal: Denies myalgias, back pain, joint swelling, arthralgias and gait problem.  Skin: Denies pallor, rash and wound.  Neurological: Denies dizziness, seizures, syncope, weakness, light-headedness, numbness and headaches.  Hematological: Denies adenopathy. Easy bruising, personal or family bleeding history  Psychiatric/Behavioral: Denies suicidal ideation, mood changes, confusion, nervousness, sleep disturbance and agitation    Physical Exam: Vitals:   11/19/21 1138  BP: 110/80  Pulse: (!) 103  Temp: 98.4 F (36.9 C)  TempSrc: Oral  SpO2: 98%  Weight: 225 lb 1.6 oz (102.1 kg)    Body mass index is 35.26 kg/m.   Constitutional: NAD, calm,  comfortable Eyes: PERRL, lids and conjunctivae normal ENMT: Mucous membranes are moist.  Neurologic: Grossly intact and nonfocal. Genitourinary: Copious amounts of thick white vaginal discharge and hyperemia of vaginal walls noted on pelvic exam. Psychiatric: Normal  judgment and insight. Alert and oriented x 3. Normal mood.    Impression and Plan:  Vaginal yeast infection  - Plan: Cervicovaginal ancillary only, fluconazole (DIFLUCAN) 150 MG tablet -Will treat with fluconazole 150 mg daily for 3 days, if wet prep confirms vaginal yeast, have advised her to discuss with her GYN any preventive measures.    Time spent: 22 minutes reviewing chart, interviewing and examining patient, formulating plan of care.    Lelon Frohlich, MD [facility]

## 2021-11-20 LAB — CERVICOVAGINAL ANCILLARY ONLY
Bacterial Vaginitis (gardnerella): NEGATIVE
Candida Glabrata: NEGATIVE
Candida Vaginitis: NEGATIVE
Chlamydia: NEGATIVE
Comment: NEGATIVE
Comment: NEGATIVE
Comment: NEGATIVE
Comment: NEGATIVE
Comment: NEGATIVE
Comment: NORMAL
Neisseria Gonorrhea: NEGATIVE
Trichomonas: NEGATIVE

## 2021-11-30 ENCOUNTER — Other Ambulatory Visit (INDEPENDENT_AMBULATORY_CARE_PROVIDER_SITE_OTHER): Payer: 59

## 2021-11-30 DIAGNOSIS — E559 Vitamin D deficiency, unspecified: Secondary | ICD-10-CM | POA: Diagnosis not present

## 2021-11-30 LAB — VITAMIN D 25 HYDROXY (VIT D DEFICIENCY, FRACTURES): VITD: 31.64 ng/mL (ref 30.00–100.00)

## 2022-01-08 DIAGNOSIS — M25532 Pain in left wrist: Secondary | ICD-10-CM | POA: Diagnosis not present

## 2022-01-08 DIAGNOSIS — S79912A Unspecified injury of left hip, initial encounter: Secondary | ICD-10-CM | POA: Diagnosis not present

## 2022-01-08 DIAGNOSIS — M25571 Pain in right ankle and joints of right foot: Secondary | ICD-10-CM | POA: Diagnosis not present

## 2022-01-08 DIAGNOSIS — T1490XA Injury, unspecified, initial encounter: Secondary | ICD-10-CM | POA: Diagnosis not present

## 2022-01-08 DIAGNOSIS — M25552 Pain in left hip: Secondary | ICD-10-CM | POA: Diagnosis not present

## 2022-01-08 DIAGNOSIS — M79662 Pain in left lower leg: Secondary | ICD-10-CM | POA: Diagnosis not present

## 2022-01-08 DIAGNOSIS — S99922A Unspecified injury of left foot, initial encounter: Secondary | ICD-10-CM | POA: Diagnosis not present

## 2022-01-08 DIAGNOSIS — R0789 Other chest pain: Secondary | ICD-10-CM | POA: Diagnosis not present

## 2022-01-08 DIAGNOSIS — S299XXA Unspecified injury of thorax, initial encounter: Secondary | ICD-10-CM | POA: Diagnosis not present

## 2022-01-08 DIAGNOSIS — R079 Chest pain, unspecified: Secondary | ICD-10-CM | POA: Diagnosis not present

## 2022-01-09 DIAGNOSIS — M25552 Pain in left hip: Secondary | ICD-10-CM | POA: Diagnosis not present

## 2022-01-09 DIAGNOSIS — M25571 Pain in right ankle and joints of right foot: Secondary | ICD-10-CM | POA: Diagnosis not present

## 2022-01-09 DIAGNOSIS — T1490XA Injury, unspecified, initial encounter: Secondary | ICD-10-CM | POA: Diagnosis not present

## 2022-01-09 DIAGNOSIS — S79912A Unspecified injury of left hip, initial encounter: Secondary | ICD-10-CM | POA: Diagnosis not present

## 2022-01-09 DIAGNOSIS — S99922A Unspecified injury of left foot, initial encounter: Secondary | ICD-10-CM | POA: Diagnosis not present

## 2022-01-09 DIAGNOSIS — R079 Chest pain, unspecified: Secondary | ICD-10-CM | POA: Diagnosis not present

## 2022-01-09 DIAGNOSIS — S299XXA Unspecified injury of thorax, initial encounter: Secondary | ICD-10-CM | POA: Diagnosis not present

## 2022-01-09 DIAGNOSIS — R0789 Other chest pain: Secondary | ICD-10-CM | POA: Diagnosis not present

## 2022-03-01 ENCOUNTER — Other Ambulatory Visit: Payer: 59

## 2022-03-23 ENCOUNTER — Ambulatory Visit: Payer: 59 | Admitting: Internal Medicine

## 2022-04-13 ENCOUNTER — Encounter: Payer: Self-pay | Admitting: Internal Medicine

## 2022-04-19 ENCOUNTER — Encounter: Payer: Self-pay | Admitting: Family Medicine

## 2022-04-19 ENCOUNTER — Telehealth: Payer: 59 | Admitting: Family Medicine

## 2022-04-19 ENCOUNTER — Ambulatory Visit (INDEPENDENT_AMBULATORY_CARE_PROVIDER_SITE_OTHER): Payer: 59 | Admitting: Family Medicine

## 2022-04-19 VITALS — BP 98/70 | HR 95 | Temp 98.5°F | Ht 67.0 in | Wt 222.5 lb

## 2022-04-19 DIAGNOSIS — R519 Headache, unspecified: Secondary | ICD-10-CM | POA: Diagnosis not present

## 2022-04-19 DIAGNOSIS — J029 Acute pharyngitis, unspecified: Secondary | ICD-10-CM | POA: Diagnosis not present

## 2022-04-19 DIAGNOSIS — R52 Pain, unspecified: Secondary | ICD-10-CM

## 2022-04-19 DIAGNOSIS — J020 Streptococcal pharyngitis: Secondary | ICD-10-CM | POA: Diagnosis not present

## 2022-04-19 LAB — POCT RAPID STREP A (OFFICE): Rapid Strep A Screen: POSITIVE — AB

## 2022-04-19 LAB — POC COVID19 BINAXNOW: SARS Coronavirus 2 Ag: NEGATIVE

## 2022-04-19 MED ORDER — AMOXICILLIN 500 MG PO CAPS: 500.0000 mg | ORAL_CAPSULE | Freq: Three times a day (TID) | ORAL | 0 refills | Status: AC

## 2022-04-19 NOTE — Progress Notes (Signed)
Established Patient Office Visit  Subjective   Patient ID: Julia Ruiz, female    DOB: 06-Sep-1986  Age: 36 y.o. MRN: [id number]  Chief Complaint  Patient presents with   Sore Throat    X2 days   Headache    X2 days   Generalized Body Aches    X2 days, tried Tylenol with some relief    HPI   Onset couple days ago of sore throat, headache, body aches.  No nausea, vomiting, or diarrhea.  No skin rash.  No cough.  No nasal congestion.  No known sick contacts.  She has 1 child at home with sore throat.  She has had some chills but no documented fever.  Has had strep throat in the past.  Keeping down fluids but poor appetite.  Took some Tylenol this morning.  Past Medical History:  Diagnosis Date   Abnormal cervical Papanicolaou smear    as teenager, normal since per her report   Complication of anesthesia    spinal headache   Genital herpes    Gestational diabetes    with first pregnancy    Obesity (BMI 30.0-34.9)    Seasonal allergies    Vitamin D deficiency    Past Surgical History:  Procedure Laterality Date   CESAREAN SECTION     CESAREAN SECTION     CESAREAN SECTION N/A 06/29/2017   Procedure: REPEAT CESAREAN SECTION;  Surgeon: Osborn Coho, MD;  Location: [facility] BIRTHING SUITES;  Service: Obstetrics;  Laterality: N/A;  Tracey RNFA    reports that she has never smoked. She has never used smokeless tobacco. She reports current alcohol use. She reports that she does not use drugs. family history includes Irritable bowel syndrome in her mother; Mental illness in her brother. Allergies  Allergen Reactions   Tetracyclines & Related Hives, Shortness Of Breath and Other (See Comments)    wheezing   Clindagel [Clindamycin] Itching    Review of Systems  Constitutional:  Positive for chills and fever.  HENT:  Positive for sore throat. Negative for congestion.   Respiratory:  Negative for cough and shortness of breath.   Neurological:  Positive for headaches.       Objective:     BP 98/70 (BP Location: Left Arm, Patient Position: Sitting, Cuff Size: Large)   Pulse 95   Temp 98.5 F (36.9 C) (Oral)   Ht 5\' 7"  (1.702 m)   Wt 222 lb 8 oz (100.9 kg)   LMP 03/20/2022 (Exact Date)   SpO2 98%   BMI 34.85 kg/m    Physical Exam Vitals reviewed.  Constitutional:      Appearance: She is not ill-appearing.  HENT:     Right Ear: Tympanic membrane normal.     Left Ear: Tympanic membrane normal.     Mouth/Throat:     Comments: Mild posterior pharynx erythema. Cardiovascular:     Rate and Rhythm: Normal rate and regular rhythm.  Pulmonary:     Effort: Pulmonary effort is normal.     Breath sounds: Normal breath sounds.  Neurological:     Mental Status: She is alert.      Results for orders placed or performed in visit on 04/19/22  POC COVID-19  Result Value Ref Range   SARS Coronavirus 2 Ag Negative Negative  POC Rapid Strep A  Result Value Ref Range   Rapid Strep A Screen Positive (A) Negative      The ASCVD Risk score (Arnett DK, et al., 2019) failed to  calculate for the following reasons:   The 2019 ASCVD risk score is only valid for ages 34 to 14    Assessment & Plan:   Strep pharyngitis.  Patient presents with 2-day history of symptoms.  Rapid strep positive.  COVID screen negative.  She is nontoxic in appearance  -Plenty of fluids and rest -Start amoxicillin 500 mg 3 times daily for 10 days -Follow-up for any persistent or worsening symptoms   No follow-ups on file.    Evelena Peat, MD

## 2022-04-21 ENCOUNTER — Telehealth (INDEPENDENT_AMBULATORY_CARE_PROVIDER_SITE_OTHER): Payer: 59 | Admitting: Internal Medicine

## 2022-04-21 ENCOUNTER — Encounter: Payer: Self-pay | Admitting: Internal Medicine

## 2022-04-21 VITALS — Ht 67.0 in | Wt 219.0 lb

## 2022-04-21 DIAGNOSIS — E669 Obesity, unspecified: Secondary | ICD-10-CM

## 2022-04-21 MED ORDER — WEGOVY 0.25 MG/0.5ML ~~LOC~~ SOAJ: 0.2500 mg | SUBCUTANEOUS | 0 refills | Status: DC

## 2022-04-21 NOTE — Progress Notes (Signed)
Virtual Visit via Video Note  I connected with Julia Ruiz on 04/21/22 at  3:30 PM EDT by a video enabled telemedicine application and verified that I am speaking with the correct person using two identifiers.  Location patient: home Location provider: work office Persons participating in the virtual visit: patient, provider  I discussed the limitations of evaluation and management by telemedicine and the availability of in person appointments. The patient expressed understanding and agreed to proceed.   HPI: She has scheduled this visit to discuss prescription for weight loss.  She has been weighing between 215 and 219 pounds for some time now.  She exercises twice a week.  She knows that she could make some positive changes with her nutrition and she is willing to do that.  She is a Emergency planning/management officer.   ROS: Constitutional: Denies fever, chills, diaphoresis, appetite change and fatigue.  HEENT: Denies photophobia, eye pain, redness, hearing loss, ear pain, congestion, sore throat, rhinorrhea, sneezing, mouth sores, trouble swallowing, neck pain, neck stiffness and tinnitus.   Respiratory: Denies SOB, DOE, cough, chest tightness,  and wheezing.   Cardiovascular: Denies chest pain, palpitations and leg swelling.  Gastrointestinal: Denies nausea, vomiting, abdominal pain, diarrhea, constipation, blood in stool and abdominal distention.  Genitourinary: Denies dysuria, urgency, frequency, hematuria, flank pain and difficulty urinating.  Endocrine: Denies: hot or cold intolerance, sweats, changes in hair or nails, polyuria, polydipsia. Musculoskeletal: Denies myalgias, back pain, joint swelling, arthralgias and gait problem.  Skin: Denies pallor, rash and wound.  Neurological: Denies dizziness, seizures, syncope, weakness, light-headedness, numbness and headaches.  Hematological: Denies adenopathy. Easy bruising, personal or family bleeding history  Psychiatric/Behavioral: Denies  suicidal ideation, mood changes, confusion, nervousness, sleep disturbance and agitation   Past Medical History:  Diagnosis Date   Abnormal cervical Papanicolaou smear    as teenager, normal since per her report   Complication of anesthesia    spinal headache   Genital herpes    Gestational diabetes    with first pregnancy    Obesity (BMI 30.0-34.9)    Seasonal allergies    Vitamin D deficiency     Past Surgical History:  Procedure Laterality Date   CESAREAN SECTION     CESAREAN SECTION     CESAREAN SECTION N/A 06/29/2017   Procedure: REPEAT CESAREAN SECTION;  Surgeon: Osborn Coho, MD;  Location: [facility] BIRTHING SUITES;  Service: Obstetrics;  Laterality: N/A;  Tracey RNFA    Family History  Problem Relation Age of Onset   Mental illness Brother    Irritable bowel syndrome Mother     SOCIAL HX:   reports that she has never smoked. She has never used smokeless tobacco. She reports current alcohol use. She reports that she does not use drugs.   Current Outpatient Medications:    amoxicillin (AMOXIL) 500 MG capsule, Take 1 capsule (500 mg total) by mouth 3 (three) times daily for 10 days., Disp: 30 capsule, Rfl: 0   Multiple Vitamin (MULTIVITAMIN WITH MINERALS) TABS tablet, Take 1 tablet by mouth daily., Disp: , Rfl:    paragard intrauterine copper IUD IUD, Take 1 device by intrauterine route., Disp: , Rfl:    Probiotic Product (PROBIOTIC DAILY PO), Take by mouth., Disp: , Rfl:    Semaglutide-Weight Management (WEGOVY) 0.25 MG/0.5ML SOAJ, Inject 0.25 mg into the skin once a week., Disp: 2 mL, Rfl: 0   VITAMIN D PO, Take by mouth daily. Vit D3, Disp: , Rfl:   EXAM:   VITALS per  patient if applicable: None reported  GENERAL: alert, oriented, appears well and in no acute distress  HEENT: atraumatic, conjunttiva clear, no obvious abnormalities on inspection of external nose and ears  NECK: normal movements of the head and neck  LUNGS: on inspection no signs of respiratory  distress, breathing rate appears normal, no obvious gross increased work of breathing, gasping or wheezing  CV: no obvious cyanosis  MS: moves all visible extremities without noticeable abnormality  PSYCH/NEURO: pleasant and cooperative, no obvious depression or anxiety, speech and thought processing grossly intact  ASSESSMENT AND PLAN:   Obesity (BMI 30.0-34.9) - Plan: Semaglutide-Weight Management (WEGOVY) 0.25 MG/0.5ML SOAJ -Discussed healthy lifestyle, including increased physical activity and better food choices to promote weight loss. -We have discussed starting Wegovy 0.25 mg weekly for the first month.  She will notify me after the third week how she is doing to see if we can increase her dose. -We have discussed side effects of nausea, belching, bloating, gassiness, early satiety.  We have also discussed black box warnings of medullary thyroid carcinoma and pancreatitis.     I discussed the assessment and treatment plan with the patient. The patient was provided an opportunity to ask questions and all were answered. The patient agreed with the plan and demonstrated an understanding of the instructions.   The patient was advised to call back or seek an in-person evaluation if the symptoms worsen or if the condition fails to improve as anticipated.    Chaya Jan, MD  [facility] at [facility]

## 2022-04-22 ENCOUNTER — Other Ambulatory Visit: Payer: Self-pay | Admitting: Internal Medicine

## 2022-04-22 ENCOUNTER — Other Ambulatory Visit (HOSPITAL_COMMUNITY): Payer: Self-pay

## 2022-04-22 ENCOUNTER — Encounter: Payer: Self-pay | Admitting: Internal Medicine

## 2022-04-22 DIAGNOSIS — E669 Obesity, unspecified: Secondary | ICD-10-CM

## 2022-04-22 DIAGNOSIS — R5383 Other fatigue: Secondary | ICD-10-CM

## 2022-04-22 MED ORDER — WEGOVY 0.25 MG/0.5ML ~~LOC~~ SOAJ
0.2500 mg | SUBCUTANEOUS | 0 refills | Status: DC
  Filled 2022-04-22 – 2022-04-28 (×2): qty 2, 30d supply, fill #0

## 2022-04-23 ENCOUNTER — Other Ambulatory Visit (HOSPITAL_COMMUNITY): Payer: Self-pay

## 2022-04-23 NOTE — Telephone Encounter (Signed)
Prior auth for [employer] .25mg  sent to [id number].

## 2022-04-26 ENCOUNTER — Other Ambulatory Visit (HOSPITAL_COMMUNITY): Payer: Self-pay

## 2022-04-28 ENCOUNTER — Other Ambulatory Visit (HOSPITAL_COMMUNITY): Payer: Self-pay

## 2022-04-28 NOTE — Telephone Encounter (Signed)
PA for Reginal Lutes has been approved from 04/26/22 - 11/15/22.

## 2022-05-13 ENCOUNTER — Other Ambulatory Visit: Payer: Self-pay | Admitting: Internal Medicine

## 2022-05-13 ENCOUNTER — Other Ambulatory Visit (HOSPITAL_COMMUNITY): Payer: Self-pay

## 2022-05-13 DIAGNOSIS — E669 Obesity, unspecified: Secondary | ICD-10-CM

## 2022-05-13 MED ORDER — WEGOVY 0.5 MG/0.5ML ~~LOC~~ SOAJ
0.5000 mg | SUBCUTANEOUS | 0 refills | Status: DC
  Filled 2022-05-13: qty 2, 30d supply, fill #0

## 2022-05-13 NOTE — Telephone Encounter (Signed)
Patient would like to increase her dosage.

## 2022-05-14 ENCOUNTER — Other Ambulatory Visit (INDEPENDENT_AMBULATORY_CARE_PROVIDER_SITE_OTHER): Payer: 59

## 2022-05-14 ENCOUNTER — Other Ambulatory Visit (HOSPITAL_COMMUNITY): Payer: Self-pay

## 2022-05-14 DIAGNOSIS — E785 Hyperlipidemia, unspecified: Secondary | ICD-10-CM

## 2022-05-14 LAB — LIPID PANEL
Cholesterol: 173 mg/dL (ref 0–200)
HDL: 46.4 mg/dL (ref >39.00)
LDL Cholesterol: 111 mg/dL — ABNORMAL HIGH (ref 0–99)
NonHDL: 126.24
Total CHOL/HDL Ratio: 4
Triglycerides: 77 mg/dL (ref 0.0–149.0)
VLDL: 15.4 mg/dL (ref 0.0–40.0)

## 2022-05-18 ENCOUNTER — Encounter: Payer: Self-pay | Admitting: Family Medicine

## 2022-05-18 ENCOUNTER — Ambulatory Visit (INDEPENDENT_AMBULATORY_CARE_PROVIDER_SITE_OTHER): Payer: 59 | Admitting: Family Medicine

## 2022-05-18 VITALS — BP 106/76 | HR 80 | Temp 98.0°F | Ht 67.0 in | Wt 219.5 lb

## 2022-05-18 DIAGNOSIS — H6983 Other specified disorders of Eustachian tube, bilateral: Secondary | ICD-10-CM

## 2022-05-18 NOTE — Progress Notes (Signed)
Established Patient Office Visit  Subjective   Patient ID: Julia Ruiz, female    DOB: [birth year]  Age: 36 y.o. MRN: [id number]  Chief Complaint  Patient presents with   Otalgia    Patient complains of Otalgia, x1 week    Sore Throat    Patient complains of sore throat, x1 week     HPI   Seen with 1 week history of some bilateral ear pressure.  No severe pain.  She had recent strep throat and states that her sore throat symptoms cleared fully after antibiotics.  Actually denies any major sore throat at this time.  No hearing loss.  No vertigo.  No tinnitus.  No recent flying or swimming.  Does have some allergies intermittently but no significant nasal congestion currently.  Past Medical History:  Diagnosis Date   Abnormal cervical Papanicolaou smear    as teenager, normal since per her report   Complication of anesthesia    spinal headache   Genital herpes    Gestational diabetes    with first pregnancy    Obesity (BMI 30.0-34.9)    Seasonal allergies    Vitamin D deficiency    Past Surgical History:  Procedure Laterality Date   CESAREAN SECTION     CESAREAN SECTION     CESAREAN SECTION N/A 06/29/2017   Procedure: REPEAT CESAREAN SECTION;  Surgeon: Osborn Coho, MD;  Location: [facility] BIRTHING SUITES;  Service: Obstetrics;  Laterality: N/A;  Tracey RNFA    reports that she has never smoked. She has never used smokeless tobacco. She reports current alcohol use. She reports that she does not use drugs. family history includes Irritable bowel syndrome in her mother; Mental illness in her brother. Allergies  Allergen Reactions   Tetracyclines & Related Hives, Shortness Of Breath and Other (See Comments)    wheezing   Clindagel [Clindamycin] Itching    Review of Systems  Constitutional:  Negative for chills and fever.  HENT:  Negative for congestion, ear discharge, hearing loss, sinus pain, sore throat and tinnitus.       Objective:     BP 106/76 (BP Location: Left  Arm, Patient Position: Sitting, Cuff Size: Normal)   Pulse 80   Temp 98 F (36.7 C) (Oral)   Ht 5\' 7"  (1.702 m)   Wt 219 lb 8 oz (99.6 kg)   LMP 03/20/2022 (Exact Date)   SpO2 100%   BMI 34.38 kg/m    Physical Exam Vitals reviewed.  Constitutional:      Appearance: She is well-developed.  HENT:     Ears:     Comments: Eardrums appear normal.  No effusion.  No erythema.  Ear canals are clear of any cerumen. Neck:     Thyroid: No thyromegaly.  Cardiovascular:     Rate and Rhythm: Normal rate and regular rhythm.  Pulmonary:     Effort: Pulmonary effort is normal.     Breath sounds: Normal breath sounds.  Musculoskeletal:     Cervical back: Neck supple.  Lymphadenopathy:     Cervical: No cervical adenopathy.  Neurological:     Mental Status: She is alert.      No results found for any visits on 05/18/22.    The ASCVD Risk score (Arnett DK, et al., 2019) failed to calculate for the following reasons:   The 2019 ASCVD risk score is only valid for ages 30 to 62    Assessment & Plan:   Problem List Items Addressed This Visit  None Visit Diagnoses     Dysfunction of both eustachian tubes    -  Primary     Bilateral ear pressure with normal exam.  Suspect eustachian tube dysfunction.  Try Valsalva maneuver.  Handout given.  Discussed limited role of medications such as decongestants, nasal steroids, etc. follow-up for any persistent or worsening symptoms  No follow-ups on file.    Evelena Peat, MD

## 2022-05-19 ENCOUNTER — Ambulatory Visit: Payer: 59 | Admitting: Internal Medicine

## 2022-05-24 ENCOUNTER — Other Ambulatory Visit (HOSPITAL_COMMUNITY): Payer: Self-pay

## 2022-05-26 ENCOUNTER — Other Ambulatory Visit (HOSPITAL_COMMUNITY): Payer: Self-pay

## 2022-06-09 ENCOUNTER — Other Ambulatory Visit: Payer: Self-pay | Admitting: Internal Medicine

## 2022-06-09 DIAGNOSIS — E669 Obesity, unspecified: Secondary | ICD-10-CM

## 2022-06-09 MED ORDER — WEGOVY 1 MG/0.5ML ~~LOC~~ SOAJ: 1.0000 mg | SUBCUTANEOUS | 0 refills | Status: DC

## 2022-06-10 ENCOUNTER — Other Ambulatory Visit (HOSPITAL_COMMUNITY): Payer: Self-pay

## 2022-06-10 MED ORDER — WEGOVY 1 MG/0.5ML ~~LOC~~ SOAJ
1.0000 mg | SUBCUTANEOUS | 1 refills | Status: DC
  Filled 2022-06-10: qty 2, 56d supply, fill #0

## 2022-06-10 NOTE — Addendum Note (Signed)
Addended by: Kern Reap B on: 06/10/2022 12:58 PM   Modules accepted: Orders

## 2022-06-14 ENCOUNTER — Other Ambulatory Visit (HOSPITAL_COMMUNITY): Payer: Self-pay

## 2022-06-23 NOTE — Progress Notes (Signed)
Erroneous encounter

## 2022-07-12 ENCOUNTER — Other Ambulatory Visit (HOSPITAL_COMMUNITY): Payer: Self-pay

## 2022-07-12 ENCOUNTER — Other Ambulatory Visit: Payer: Self-pay | Admitting: Internal Medicine

## 2022-07-12 DIAGNOSIS — E669 Obesity, unspecified: Secondary | ICD-10-CM

## 2022-07-12 MED ORDER — WEGOVY 1.7 MG/0.75ML ~~LOC~~ SOAJ
1.7000 mg | SUBCUTANEOUS | 0 refills | Status: DC
  Filled 2022-07-12: qty 3, 28d supply, fill #0

## 2022-08-12 ENCOUNTER — Encounter: Payer: Self-pay | Admitting: Internal Medicine

## 2022-08-12 ENCOUNTER — Other Ambulatory Visit: Payer: Self-pay | Admitting: Internal Medicine

## 2022-08-12 DIAGNOSIS — E669 Obesity, unspecified: Secondary | ICD-10-CM

## 2022-08-13 ENCOUNTER — Other Ambulatory Visit (HOSPITAL_COMMUNITY): Payer: Self-pay

## 2022-08-13 ENCOUNTER — Other Ambulatory Visit: Payer: Self-pay | Admitting: Internal Medicine

## 2022-08-13 DIAGNOSIS — E669 Obesity, unspecified: Secondary | ICD-10-CM

## 2022-08-13 NOTE — Telephone Encounter (Signed)
Pt checking on progress of this refill request. Needs this medication to stay on course

## 2022-08-16 ENCOUNTER — Other Ambulatory Visit (HOSPITAL_COMMUNITY): Payer: Self-pay

## 2022-08-16 MED ORDER — WEGOVY 1.7 MG/0.75ML ~~LOC~~ SOAJ
1.7000 mg | SUBCUTANEOUS | 0 refills | Status: DC
  Filled 2022-08-16: qty 3, 28d supply, fill #0

## 2022-08-16 NOTE — Addendum Note (Signed)
Addended by: Westley Hummer B on: 08/16/2022 03:37 PM   Modules accepted: Orders

## 2022-08-17 ENCOUNTER — Other Ambulatory Visit (HOSPITAL_COMMUNITY): Payer: Self-pay

## 2022-08-31 ENCOUNTER — Ambulatory Visit (INDEPENDENT_AMBULATORY_CARE_PROVIDER_SITE_OTHER): Payer: 59 | Admitting: Internal Medicine

## 2022-08-31 ENCOUNTER — Encounter: Payer: 59 | Admitting: Internal Medicine

## 2022-08-31 ENCOUNTER — Encounter: Payer: Self-pay | Admitting: Internal Medicine

## 2022-08-31 VITALS — BP 110/78 | HR 85 | Temp 98.4°F | Ht 67.0 in | Wt 199.5 lb

## 2022-08-31 DIAGNOSIS — H539 Unspecified visual disturbance: Secondary | ICD-10-CM

## 2022-08-31 DIAGNOSIS — Z0000 Encounter for general adult medical examination without abnormal findings: Secondary | ICD-10-CM

## 2022-08-31 DIAGNOSIS — E559 Vitamin D deficiency, unspecified: Secondary | ICD-10-CM

## 2022-08-31 DIAGNOSIS — E669 Obesity, unspecified: Secondary | ICD-10-CM

## 2022-08-31 DIAGNOSIS — E782 Mixed hyperlipidemia: Secondary | ICD-10-CM

## 2022-08-31 MED ORDER — WEGOVY 2.4 MG/0.75ML ~~LOC~~ SOAJ: 2.4000 mg | SUBCUTANEOUS | 2 refills | Status: DC

## 2022-08-31 NOTE — Progress Notes (Signed)
Established Patient Office Visit     CC/Reason for Visit: Annual preventive exam  HPI: Julia Ruiz is a 36 y.o. female who is coming in today for the above mentioned reasons. Past Medical History is significant for: Obesity, history of vitamin D deficiency and mild hyperlipidemia.  She is feeling well today and has no acute concerns or complaints.  She is requesting an ophthalmology referral, sees dentist routinely, she is overdue for an updated COVID-vaccine.  She is requesting that we increase her Wegovy to the next dose which would be 2.4.   Past Medical/Surgical History: Past Medical History:  Diagnosis Date   Abnormal cervical Papanicolaou smear    as teenager, normal since per her report   Complication of anesthesia    spinal headache   Genital herpes    Gestational diabetes    with first pregnancy    Obesity (BMI 30.0-34.9)    Seasonal allergies    Vitamin D deficiency     Past Surgical History:  Procedure Laterality Date   CESAREAN SECTION     CESAREAN SECTION     CESAREAN SECTION N/A 06/29/2017   Procedure: REPEAT CESAREAN SECTION;  Surgeon: Everett Graff, MD;  Location: [place];  Service: Obstetrics;  Laterality: N/A;  Tracey RNFA    Social History:  reports that she has never smoked. She has never used smokeless tobacco. She reports current alcohol use. She reports that she does not use drugs.  Allergies: Allergies  Allergen Reactions   Tetracyclines & Related Hives, Shortness Of Breath and Other (See Comments)    wheezing   Clindagel [Clindamycin] Itching    Family History:  Family History  Problem Relation Age of Onset   Mental illness Brother    Irritable bowel syndrome Mother      Current Outpatient Medications:    Multiple Vitamin (MULTIVITAMIN WITH MINERALS) TABS tablet, Take 1 tablet by mouth daily., Disp: , Rfl:    paragard intrauterine copper IUD IUD, Take 1 device by intrauterine route., Disp: , Rfl:    Probiotic Product  (PROBIOTIC DAILY PO), Take by mouth., Disp: , Rfl:    Semaglutide-Weight Management (WEGOVY) 2.4 MG/0.75ML SOAJ, Inject 2.4 mg into the skin once a week., Disp: 3 mL, Rfl: 2   VITAMIN D PO, Take by mouth daily. Vit D3, Disp: , Rfl:   Review of Systems:  Constitutional: Denies fever, chills, diaphoresis, appetite change and fatigue.  HEENT: Denies photophobia, eye pain, redness, hearing loss, ear pain, congestion, sore throat, rhinorrhea, sneezing, mouth sores, trouble swallowing, neck pain, neck stiffness and tinnitus.   Respiratory: Denies SOB, DOE, cough, chest tightness,  and wheezing.   Cardiovascular: Denies chest pain, palpitations and leg swelling.  Gastrointestinal: Denies nausea, vomiting, abdominal pain, diarrhea, constipation, blood in stool and abdominal distention.  Genitourinary: Denies dysuria, urgency, frequency, hematuria, flank pain and difficulty urinating.  Endocrine: Denies: hot or cold intolerance, sweats, changes in hair or nails, polyuria, polydipsia. Musculoskeletal: Denies myalgias, back pain, joint swelling, arthralgias and gait problem.  Skin: Denies pallor, rash and wound.  Neurological: Denies dizziness, seizures, syncope, weakness, light-headedness, numbness and headaches.  Hematological: Denies adenopathy. Easy bruising, personal or family bleeding history  Psychiatric/Behavioral: Denies suicidal ideation, mood changes, confusion, nervousness, sleep disturbance and agitation    Physical Exam: Vitals:   08/31/22 1455  BP: 110/78  Pulse: 85  Temp: 98.4 F (36.9 C)  TempSrc: Oral  SpO2: 99%  Weight: 199 lb 8 oz (90.5 kg)  Height: 5\' 7"  (  1.702 m)    Body mass index is 31.25 kg/m.   Constitutional: NAD, calm, comfortable Eyes: PERRL, lids and conjunctivae normal ENMT: Mucous membranes are moist. Posterior pharynx clear of any exudate or lesions. Normal dentition. Tympanic membrane is pearly white, no erythema or bulging. Neck: normal, supple, no  masses, no thyromegaly Respiratory: clear to auscultation bilaterally, no wheezing, no crackles. Normal respiratory effort. No accessory muscle use.  Cardiovascular: Regular rate and rhythm, no murmurs / rubs / gallops. No extremity edema. 2+ pedal pulses. No carotid bruits.  Abdomen: no tenderness, no masses palpated. No hepatosplenomegaly. Bowel sounds positive.  Musculoskeletal: no clubbing / cyanosis. No joint deformity upper and lower extremities. Good ROM, no contractures. Normal muscle tone.  Skin: no rashes, lesions, ulcers. No induration Neurologic: CN 2-12 grossly intact. Sensation intact, DTR normal. Strength 5/5 in all 4.  Psychiatric: Normal judgment and insight. Alert and oriented x 3. Normal mood.   Flowsheet Row Office Visit from 08/31/2022 in [facility] [place]  PHQ-9 Total Score 1         Impression and Plan:  Encounter for preventive health examination  Obesity (BMI 30.0-34.9) - Plan: CBC with Differential/Platelet, Comprehensive metabolic panel, Hemoglobin A1c, Lipid panel, TSH, Vitamin B12, VITAMIN D 25 Hydroxy (Vit-D Deficiency, Fractures), Semaglutide-Weight Management (WEGOVY) 2.4 MG/0.75ML SOAJ  Vitamin D deficiency  Mixed hyperlipidemia   -Recommend routine eye and dental care. -Immunizations: Advised to get COVID at pharmacy, other immunizations up-to-date -Healthy lifestyle discussed in detail. -Labs to be updated today. -Colon cancer screening: Commence age 23 -Breast cancer screening: Commence age 51 -Cervical cancer screening: 09/2020 -Lung cancer screening: Not applicable -Prostate cancer screening: Not applicable -DEXA: Not applicable  -She is requesting increase of Wegovy from 1.7 to 2.4 mg weekly.  She has tolerated the medication well and is down approximately 25 pounds.     Chaya Jan, MD [facility] at [facility]

## 2022-08-31 NOTE — Addendum Note (Signed)
Addended by: Kern Reap B on: 08/31/2022 04:12 PM   Modules accepted: Orders

## 2022-09-01 LAB — TSH: TSH: 0.63 u[IU]/mL (ref 0.35–5.50)

## 2022-09-01 LAB — COMPREHENSIVE METABOLIC PANEL
ALT: 14 U/L (ref 0–35)
AST: 16 U/L (ref 0–37)
Albumin: 4.3 g/dL (ref 3.5–5.2)
Alkaline Phosphatase: 45 U/L (ref 39–117)
BUN: 10 mg/dL (ref 6–23)
CO2: 27 mEq/L (ref 19–32)
Calcium: 9.2 mg/dL (ref 8.4–10.5)
Chloride: 103 mEq/L (ref 96–112)
Creatinine, Ser: 0.67 mg/dL (ref 0.40–1.20)
GFR: 111.83 mL/min (ref >60.00)
Glucose, Bld: 84 mg/dL (ref 70–99)
Potassium: 3.7 mEq/L (ref 3.5–5.1)
Sodium: 136 mEq/L (ref 135–145)
Total Bilirubin: 1 mg/dL (ref 0.2–1.2)
Total Protein: 7.5 g/dL (ref 6.0–8.3)

## 2022-09-01 LAB — CBC WITH DIFFERENTIAL/PLATELET
Basophils Absolute: 0 10*3/uL (ref 0.0–0.1)
Basophils Relative: 0.5 % (ref 0.0–3.0)
Eosinophils Absolute: 0.2 10*3/uL (ref 0.0–0.7)
Eosinophils Relative: 4.3 % (ref 0.0–5.0)
HCT: 36.6 % (ref 36.0–46.0)
Hemoglobin: 11.8 g/dL — ABNORMAL LOW (ref 12.0–15.0)
Lymphocytes Relative: 36 % (ref 12.0–46.0)
Lymphs Abs: 1.9 10*3/uL (ref 0.7–4.0)
MCHC: 32.4 g/dL (ref 30.0–36.0)
MCV: 84.6 fl (ref 78.0–100.0)
Monocytes Absolute: 0.4 10*3/uL (ref 0.1–1.0)
Monocytes Relative: 8.6 % (ref 3.0–12.0)
Neutro Abs: 2.6 10*3/uL (ref 1.4–7.7)
Neutrophils Relative %: 50.6 % (ref 43.0–77.0)
Platelets: 303 10*3/uL (ref 150.0–400.0)
RBC: 4.33 Mil/uL (ref 3.87–5.11)
RDW: 13.8 % (ref 11.5–15.5)
WBC: 5.2 10*3/uL (ref 4.0–10.5)

## 2022-09-01 LAB — HEMOGLOBIN A1C: Hgb A1c MFr Bld: 5.1 % (ref 4.6–6.5)

## 2022-09-01 LAB — VITAMIN B12: Vitamin B-12: 265 pg/mL (ref 211–911)

## 2022-09-01 LAB — VITAMIN D 25 HYDROXY (VIT D DEFICIENCY, FRACTURES): VITD: 28.21 ng/mL — ABNORMAL LOW (ref 30.00–100.00)

## 2022-09-01 LAB — LIPID PANEL
Cholesterol: 154 mg/dL (ref 0–200)
HDL: 40.7 mg/dL (ref >39.00)
LDL Cholesterol: 100 mg/dL — ABNORMAL HIGH (ref 0–99)
NonHDL: 113.58
Total CHOL/HDL Ratio: 4
Triglycerides: 67 mg/dL (ref 0.0–149.0)
VLDL: 13.4 mg/dL (ref 0.0–40.0)

## 2022-10-06 ENCOUNTER — Other Ambulatory Visit (HOSPITAL_COMMUNITY): Payer: Self-pay

## 2022-10-06 ENCOUNTER — Encounter: Payer: Self-pay | Admitting: Internal Medicine

## 2022-10-06 DIAGNOSIS — E669 Obesity, unspecified: Secondary | ICD-10-CM

## 2022-10-06 MED ORDER — WEGOVY 2.4 MG/0.75ML ~~LOC~~ SOAJ
2.4000 mg | SUBCUTANEOUS | 2 refills | Status: DC
  Filled 2022-10-06 – 2022-10-14 (×2): qty 3, 28d supply, fill #0
  Filled 2022-11-06 – 2022-12-09 (×3): qty 3, 28d supply, fill #1

## 2022-10-14 ENCOUNTER — Other Ambulatory Visit (HOSPITAL_COMMUNITY): Payer: Self-pay

## 2022-11-08 ENCOUNTER — Other Ambulatory Visit (HOSPITAL_COMMUNITY): Payer: Self-pay

## 2022-11-08 ENCOUNTER — Encounter (HOSPITAL_COMMUNITY): Payer: Self-pay

## 2022-11-19 DIAGNOSIS — Z6828 Body mass index (BMI) 28.0-28.9, adult: Secondary | ICD-10-CM | POA: Diagnosis not present

## 2022-11-19 DIAGNOSIS — N898 Other specified noninflammatory disorders of vagina: Secondary | ICD-10-CM | POA: Diagnosis not present

## 2022-11-19 DIAGNOSIS — Z30431 Encounter for routine checking of intrauterine contraceptive device: Secondary | ICD-10-CM | POA: Diagnosis not present

## 2022-12-01 ENCOUNTER — Telehealth: Payer: Self-pay | Admitting: *Deleted

## 2022-12-01 ENCOUNTER — Other Ambulatory Visit (HOSPITAL_COMMUNITY): Payer: Self-pay

## 2022-12-01 ENCOUNTER — Encounter: Payer: Self-pay | Admitting: Internal Medicine

## 2022-12-01 NOTE — Telephone Encounter (Signed)
Prior Julia Ruiz has been started for  [facility] Key: [id number]

## 2022-12-02 NOTE — Telephone Encounter (Signed)
More information given.

## 2022-12-06 ENCOUNTER — Other Ambulatory Visit (HOSPITAL_COMMUNITY): Payer: Self-pay

## 2022-12-06 NOTE — Telephone Encounter (Signed)
PA was approved 12/03/22 - 12/03/23

## 2022-12-09 ENCOUNTER — Other Ambulatory Visit: Payer: Self-pay

## 2022-12-17 DIAGNOSIS — H5213 Myopia, bilateral: Secondary | ICD-10-CM | POA: Diagnosis not present

## 2023-01-02 ENCOUNTER — Encounter: Payer: Self-pay | Admitting: Internal Medicine

## 2023-01-26 ENCOUNTER — Encounter: Payer: Self-pay | Admitting: Internal Medicine

## 2023-01-26 ENCOUNTER — Ambulatory Visit: Payer: 59 | Admitting: Internal Medicine

## 2023-01-26 VITALS — BP 110/74 | HR 82 | Temp 98.2°F | Ht 67.0 in | Wt 183.2 lb

## 2023-01-26 DIAGNOSIS — Z01818 Encounter for other preprocedural examination: Secondary | ICD-10-CM

## 2023-01-26 LAB — CBC WITH DIFFERENTIAL/PLATELET
Basophils Absolute: 0 10*3/uL (ref 0.0–0.1)
Basophils Relative: 0.4 % (ref 0.0–3.0)
Eosinophils Absolute: 0.2 10*3/uL (ref 0.0–0.7)
Eosinophils Relative: 5.3 % — ABNORMAL HIGH (ref 0.0–5.0)
HCT: 36.5 % (ref 36.0–46.0)
Hemoglobin: 12 g/dL (ref 12.0–15.0)
Lymphocytes Relative: 41.2 % (ref 12.0–46.0)
Lymphs Abs: 1.9 10*3/uL (ref 0.7–4.0)
MCHC: 32.9 g/dL (ref 30.0–36.0)
MCV: 84.4 fl (ref 78.0–100.0)
Monocytes Absolute: 0.3 10*3/uL (ref 0.1–1.0)
Monocytes Relative: 6.4 % (ref 3.0–12.0)
Neutro Abs: 2.1 10*3/uL (ref 1.4–7.7)
Neutrophils Relative %: 46.7 % (ref 43.0–77.0)
Platelets: 301 10*3/uL (ref 150.0–400.0)
RBC: 4.32 Mil/uL (ref 3.87–5.11)
RDW: 14.1 % (ref 11.5–15.5)
WBC: 4.6 10*3/uL (ref 4.0–10.5)

## 2023-01-26 LAB — COMPREHENSIVE METABOLIC PANEL
ALT: 21 U/L (ref 0–35)
AST: 18 U/L (ref 0–37)
Albumin: 4.6 g/dL (ref 3.5–5.2)
Alkaline Phosphatase: 60 U/L (ref 39–117)
BUN: 10 mg/dL (ref 6–23)
CO2: 26 mEq/L (ref 19–32)
Calcium: 9.5 mg/dL (ref 8.4–10.5)
Chloride: 104 mEq/L (ref 96–112)
Creatinine, Ser: 0.7 mg/dL (ref 0.40–1.20)
GFR: 110.35 mL/min (ref >60.00)
Glucose, Bld: 83 mg/dL (ref 70–99)
Potassium: 3.9 mEq/L (ref 3.5–5.1)
Sodium: 138 mEq/L (ref 135–145)
Total Bilirubin: 1.5 mg/dL — ABNORMAL HIGH (ref 0.2–1.2)
Total Protein: 7.7 g/dL (ref 6.0–8.3)

## 2023-01-26 LAB — PROTIME-INR
INR: 1.1 ratio — ABNORMAL HIGH (ref 0.8–1.0)
Prothrombin Time: 12.1 s (ref 9.6–13.1)

## 2023-01-26 LAB — TSH: TSH: 1.01 u[IU]/mL (ref 0.35–5.50)

## 2023-01-26 LAB — APTT: aPTT: 38.1 s — ABNORMAL HIGH (ref 25.4–36.8)

## 2023-01-26 NOTE — Progress Notes (Signed)
Established Patient Office Visit     CC/Reason for Visit: Preoperative clearance  HPI: Julia Ruiz is a 37 y.o. female who is coming in today for the above mentioned reasons. Past Medical History is significant for: She is scheduled to have an abdominoplasty on May 15 in [place].  She needs to have labs done and surgical clearance.  She feels well.  She does not have chest pain or shortness of breath with physical activity.   Past Medical/Surgical History: Past Medical History:  Diagnosis Date   Abnormal cervical Papanicolaou smear    as teenager, normal since per her report   Complication of anesthesia    spinal headache   Genital herpes    Gestational diabetes    with first pregnancy    Obesity (BMI 30.0-34.9)    Seasonal allergies    Vitamin D deficiency     Past Surgical History:  Procedure Laterality Date   CESAREAN SECTION     CESAREAN SECTION     CESAREAN SECTION N/A 06/29/2017   Procedure: REPEAT CESAREAN SECTION;  Surgeon: Osborn Coho, MD;  Location: [facility] BIRTHING SUITES;  Service: Obstetrics;  Laterality: N/A;  Tracey RNFA    Social History:  reports that she has never smoked. She has never used smokeless tobacco. She reports current alcohol use. She reports that she does not use drugs.  Allergies: Allergies  Allergen Reactions   Tetracyclines & Related Hives, Shortness Of Breath and Other (See Comments)    wheezing   Clindagel [Clindamycin] Itching    Family History:  Family History  Problem Relation Age of Onset   Mental illness Brother    Irritable bowel syndrome Mother      Current Outpatient Medications:    Multiple Vitamin (MULTIVITAMIN WITH MINERALS) TABS tablet, Take 1 tablet by mouth daily., Disp: , Rfl:    paragard intrauterine copper IUD IUD, Take 1 device by intrauterine route., Disp: , Rfl:    Probiotic Product (PROBIOTIC DAILY PO), Take by mouth., Disp: , Rfl:    VITAMIN D PO, Take by mouth daily. Vit D3, Disp: , Rfl:    Review of Systems:  Negative unless indicated in HPI.   Physical Exam: Vitals:   01/26/23 1030  BP: 110/74  Pulse: 82  Temp: 98.2 F (36.8 C)  TempSrc: Oral  SpO2: 99%  Weight: 183 lb 3.2 oz (83.1 kg)  Height: 5\' 7"  (1.702 m)    Body mass index is 28.69 kg/m.   Physical Exam Vitals reviewed.  Constitutional:      Appearance: Normal appearance.  HENT:     Head: Normocephalic and atraumatic.  Eyes:     Conjunctiva/sclera: Conjunctivae normal.     Pupils: Pupils are equal, round, and reactive to light.  Cardiovascular:     Rate and Rhythm: Normal rate and regular rhythm.  Pulmonary:     Effort: Pulmonary effort is normal.     Breath sounds: Normal breath sounds.  Skin:    General: Skin is warm and dry.  Neurological:     General: No focal deficit present.     Mental Status: She is alert and oriented to person, place, and time.  Psychiatric:        Mood and Affect: Mood normal.        Behavior: Behavior normal.        Thought Content: Thought content normal.        Judgment: Judgment normal.      Impression and Plan:  Preoperative clearance - Plan: CBC with Differential/Platelet, Comprehensive metabolic panel, TSH, HIV Antibody (routine testing w rflx), Hepatitis C antibody, Hep B Surface Antigen, Protime-INR, APTT  -Labs will be ordered as requested by surgeon.  She will provide fax number that these need to be sent to. -She is considered low risk and can proceed to surgery without further workup.  Time spent:30 minutes reviewing chart, interviewing and examining patient and formulating plan of care.     Chaya Jan, MD [facility]

## 2023-01-27 LAB — HIV ANTIBODY (ROUTINE TESTING W REFLEX): HIV 1&2 Ab, 4th Generation: NONREACTIVE

## 2023-01-27 LAB — HEPATITIS B SURFACE ANTIGEN: Hepatitis B Surface Ag: NONREACTIVE

## 2023-01-27 LAB — HEPATITIS C ANTIBODY: Hepatitis C Ab: NONREACTIVE

## 2023-01-31 ENCOUNTER — Encounter: Payer: Self-pay | Admitting: Internal Medicine

## 2023-03-07 ENCOUNTER — Encounter: Payer: Self-pay | Admitting: Internal Medicine

## 2023-03-08 ENCOUNTER — Other Ambulatory Visit: Payer: Self-pay | Admitting: Internal Medicine

## 2023-03-08 DIAGNOSIS — Z9889 Other specified postprocedural states: Secondary | ICD-10-CM

## 2023-03-08 MED ORDER — OXYCODONE HCL 5 MG PO TABS
5.0000 mg | ORAL_TABLET | ORAL | 0 refills | Status: DC | PRN
Start: 2023-03-08 — End: 2024-06-14

## 2023-03-28 ENCOUNTER — Encounter: Payer: Self-pay | Admitting: Internal Medicine

## 2023-03-28 MED ORDER — FLUCONAZOLE 150 MG PO TABS: 150.0000 mg | ORAL_TABLET | Freq: Once | ORAL | 0 refills | Status: DC

## 2023-04-04 MED ORDER — FLUCONAZOLE 150 MG PO TABS: 150.0000 mg | ORAL_TABLET | Freq: Once | ORAL | 0 refills | Status: AC

## 2023-04-04 NOTE — Addendum Note (Signed)
Addended by: Kern Reap B on: 04/04/2023 01:22 PM   Modules accepted: Orders

## 2023-06-28 ENCOUNTER — Encounter: Payer: Self-pay | Admitting: Internal Medicine

## 2023-06-28 DIAGNOSIS — Z113 Encounter for screening for infections with a predominantly sexual mode of transmission: Secondary | ICD-10-CM | POA: Diagnosis not present

## 2023-06-28 DIAGNOSIS — Z30431 Encounter for routine checking of intrauterine contraceptive device: Secondary | ICD-10-CM | POA: Diagnosis not present

## 2023-06-28 DIAGNOSIS — Z124 Encounter for screening for malignant neoplasm of cervix: Secondary | ICD-10-CM | POA: Diagnosis not present

## 2023-06-28 DIAGNOSIS — E559 Vitamin D deficiency, unspecified: Secondary | ICD-10-CM | POA: Diagnosis not present

## 2023-06-28 DIAGNOSIS — Z01419 Encounter for gynecological examination (general) (routine) without abnormal findings: Secondary | ICD-10-CM | POA: Diagnosis not present

## 2023-06-28 LAB — HM PAP SMEAR

## 2023-12-30 ENCOUNTER — Encounter: Payer: Self-pay | Admitting: Internal Medicine

## 2024-01-03 ENCOUNTER — Ambulatory Visit: Admitting: Internal Medicine

## 2024-01-03 ENCOUNTER — Encounter: Payer: Self-pay | Admitting: Internal Medicine

## 2024-01-03 VITALS — BP 110/70 | HR 70 | Temp 98.2°F | Wt 200.5 lb

## 2024-01-03 DIAGNOSIS — N6321 Unspecified lump in the left breast, upper outer quadrant: Secondary | ICD-10-CM | POA: Diagnosis not present

## 2024-01-03 DIAGNOSIS — N644 Mastodynia: Secondary | ICD-10-CM

## 2024-01-03 NOTE — Progress Notes (Signed)
     Established Patient Office Visit     CC/Reason for Visit: Left breast pain  HPI: Julia Ruiz is a 38 y.o. female who is coming in today for the above mentioned reasons.  Has been present for a few weeks.  She has been moving.  No injuries that she can recall.  She has not noticed any lumps or nipple discharge.   Past Medical/Surgical History: Past Medical History:  Diagnosis Date   Abnormal cervical Papanicolaou smear    as teenager, normal since per her report   Complication of anesthesia    spinal headache   Genital herpes    Gestational diabetes    with first pregnancy    Obesity (BMI 30.0-34.9)    Seasonal allergies    Vitamin D deficiency     Past Surgical History:  Procedure Laterality Date   CESAREAN SECTION     CESAREAN SECTION     CESAREAN SECTION N/A 06/29/2017   Procedure: REPEAT CESAREAN SECTION;  Surgeon: Osborn Coho, MD;  Location: [facility] BIRTHING SUITES;  Service: Obstetrics;  Laterality: N/A;  Tracey RNFA    Social History:  reports that she has never smoked. She has never used smokeless tobacco. She reports current alcohol use. She reports that she does not use drugs.  Allergies: Allergies  Allergen Reactions   Tetracyclines & Related Hives, Shortness Of Breath and Other (See Comments)    wheezing   Clindagel [Clindamycin] Itching    Family History:  Family History  Problem Relation Age of Onset   Mental illness Brother    Irritable bowel syndrome Mother      Current Outpatient Medications:    Multiple Vitamin (MULTIVITAMIN WITH MINERALS) TABS tablet, Take 1 tablet by mouth daily., Disp: , Rfl:    oxyCODONE (OXY IR/ROXICODONE) 5 MG immediate release tablet, Take 1 tablet (5 mg total) by mouth every 4 (four) hours as needed for severe pain., Disp: 20 tablet, Rfl: 0   paragard intrauterine copper IUD IUD, Take 1 device by intrauterine route., Disp: , Rfl:    Probiotic Product (PROBIOTIC DAILY PO), Take by mouth., Disp: , Rfl:    VITAMIN  D PO, Take by mouth daily. Vit D3, Disp: , Rfl:   Review of Systems:  Negative unless indicated in HPI.   Physical Exam: Vitals:   01/03/24 1326  BP: 110/70  Pulse: 70  Temp: 98.2 F (36.8 C)  TempSrc: Oral  SpO2: 99%  Weight: 200 lb 8 oz (90.9 kg)    Body mass index is 31.4 kg/m.   Physical Exam Chest:  Breasts:    Right: Normal.     Left: No bleeding, inverted nipple, nipple discharge, skin change or tenderness.     Comments: Small lump on left breast at 1 o'clock position. Lymphadenopathy:     Upper Body:     Right upper body: No supraclavicular, axillary or pectoral adenopathy.     Left upper body: No supraclavicular, axillary or pectoral adenopathy.      Impression and Plan:  Breast lump on left side at 1 o'clock position  Breast pain, left   -I will send her for diagnostic mammogram and ultrasound of the left breast.  Time spent:30 minutes reviewing chart, interviewing and examining patient and formulating plan of care.     Chaya Jan, MD [facility]

## 2024-01-03 NOTE — Addendum Note (Signed)
 Addended by: Kern Reap B on: 01/03/2024 02:07 PM   Modules accepted: Orders

## 2024-01-04 ENCOUNTER — Encounter: Payer: Self-pay | Admitting: Internal Medicine

## 2024-01-18 ENCOUNTER — Ambulatory Visit
Admission: RE | Admit: 2024-01-18 | Discharge: 2024-01-18 | Disposition: A | Discharge status: Home / Self Care | Source: Ambulatory Visit | Attending: Internal Medicine | Admitting: Internal Medicine

## 2024-01-18 DIAGNOSIS — N644 Mastodynia: Secondary | ICD-10-CM

## 2024-01-18 DIAGNOSIS — N6321 Unspecified lump in the left breast, upper outer quadrant: Secondary | ICD-10-CM | POA: Diagnosis not present

## 2024-06-14 ENCOUNTER — Encounter: Payer: Self-pay | Admitting: Physician Assistant

## 2024-06-14 ENCOUNTER — Ambulatory Visit: Admitting: Physician Assistant

## 2024-06-14 VITALS — BP 107/74 | HR 86 | Ht 67.0 in | Wt 201.0 lb

## 2024-06-14 DIAGNOSIS — Z0000 Encounter for general adult medical examination without abnormal findings: Secondary | ICD-10-CM

## 2024-06-14 DIAGNOSIS — K5900 Constipation, unspecified: Secondary | ICD-10-CM | POA: Diagnosis not present

## 2024-06-14 NOTE — Progress Notes (Signed)
 New Patient Office Visit  Subjective    Patient ID: Julia Ruiz, female    DOB: 07-17-1986  Age: 38 y.o. MRN: [id number]  CC:  Chief Complaint  Patient presents with   Annual Exam    Employment physical     Discussed the use of AI scribe software for clinical note transcription with the patient, who gave verbal consent to proceed.  History of Present Illness   Julia Ruiz is a 38 year old female who presents for an annual wellness exam.  She uses an IUD and takes vitamin D  supplements.   She experiences minor constipation over the past six months, with bowel movements every other day, often in pellet form, without straining. This began after starting prebiotics and probiotics. Her water intake has decreased to two to three bottles daily, and her physical activity has reduced due to a recent relocation.  Her sleep quality is great, and her appetite is good. She works from home for [employer], Software engineer. She is adjusting to a new area, impacting her routine physical activity, such as walking her dog.    Outpatient Encounter Medications as of 06/14/2024  Medication Sig   Multiple Vitamin (MULTIVITAMIN WITH MINERALS) TABS tablet Take 1 tablet by mouth daily.   paragard  intrauterine copper  IUD IUD Take 1 device by intrauterine route.   Probiotic Product (PROBIOTIC DAILY PO) Take by mouth.   VITAMIN D  PO Take by mouth daily. Vit D3   [DISCONTINUED] oxyCODONE  (OXY IR/ROXICODONE ) 5 MG immediate release tablet Take 1 tablet (5 mg total) by mouth every 4 (four) hours as needed for severe pain.   No facility-administered encounter medications on file as of 06/14/2024.    Past Medical History:  Diagnosis Date   Abnormal cervical Papanicolaou smear    as teenager, normal since per her report   Complication of anesthesia    spinal headache   Genital herpes    Gestational diabetes    with first pregnancy    Obesity (BMI 30.0-34.9)     Seasonal allergies    Vitamin D  deficiency     Past Surgical History:  Procedure Laterality Date   CESAREAN SECTION     CESAREAN SECTION     CESAREAN SECTION N/A 06/29/2017   Procedure: REPEAT CESAREAN SECTION;  Surgeon: Henry Slough, MD;  Location: [facility] BIRTHING SUITES;  Service: Obstetrics;  Laterality: N/A;  Tracey RNFA    Family History  Problem Relation Age of Onset   Mental illness Brother    Irritable bowel syndrome Mother     Social History   Socioeconomic History   Marital status: Married    Spouse name: Not on file   Number of children: Not on file   Years of education: Not on file   Highest education level: Not on file  Occupational History   Not on file  Tobacco Use   Smoking status: Never   Smokeless tobacco: Never  Vaping Use   Vaping status: Never Used  Substance and Sexual Activity   Alcohol use: Yes    Comment: occasional   Drug use: No   Sexual activity: Yes  Other Topics Concern   Not on file  Social History Narrative   ** Merged History Encounter **       Work or School: Airline pilot - registration      Home Situation: lives husband and 2 children      Spiritual Beliefs: Christian      Lifestyle: no regular  exercise; diet is good   Social Drivers of Corporate investment banker Strain: Not on file  Food Insecurity: Not on file  Transportation Needs: Not on file  Physical Activity: Not on file  Stress: Not on file  Social Connections: Not on file  Intimate Partner Violence: Not on file    Review of Systems  Constitutional: Negative.   HENT: Negative.    Eyes: Negative.   Respiratory:  Negative for shortness of breath.   Cardiovascular:  Negative for chest pain.  Gastrointestinal: Negative.   Genitourinary: Negative.   Musculoskeletal: Negative.   Skin: Negative.   Neurological: Negative.   Endo/Heme/Allergies: Negative.   Psychiatric/Behavioral: Negative.          Objective    BP 107/74 (BP Location: Left Arm,  Patient Position: Sitting, Cuff Size: Large)   Pulse 86   Ht 5' 7 (1.702 m)   Wt 201 lb (91.2 kg)   LMP 06/13/2024   BMI 31.48 kg/m   Physical Exam Vitals and nursing note reviewed.    GENERAL: Alert, cooperative, well developed, no acute distress. HEENT: Normocephalic, normal oropharynx, moist mucous membranes. CHEST: Clear to auscultation bilaterally, no wheezes, rhonchi, or crackles. CARDIOVASCULAR: Normal heart rate and rhythm, S1 and S2 normal without murmurs. EXTREMITIES: No cyanosis or edema. NEUROLOGICAL: Cranial nerves grossly intact, moves all extremities without gross motor or sensory deficit.    Assessment & Plan:   Problem List Items Addressed This Visit   None Visit Diagnoses       Wellness examination    -  Primary     Constipation, unspecified constipation type          Assessment and Plan Adult Wellness Visit Routine wellness visit with no acute concerns. Current medications include an IUD, vitamin D , and prebiotics/probiotics. Labs in April 2024 showed normal hemoglobin, anemia, kidney, liver, and thyroid  function. Cholesterol was normal in November 2023. Emphasized annual cholesterol screening.   Constipation Constipation with decreased bowel movement frequency and pellet-like stools. Possible contributing factors include decreased water intake - Increase water intake to 64 ounces daily. - Monitor fiber intake to ensure 30 grams per day. - Consider using My Fitness Pal or Lose It to track fiber intake. - If constipation persists, consider using Miralax as needed.   I have reviewed the patient's medical history (PMH, PSH, Social History, Family History, Medications, and allergies) , and have been updated if relevant. I spent 30 minutes reviewing chart and  face to face time with patient.    Return if symptoms worsen or fail to improve.   Julia RAMAN Mayers, PA-C

## 2024-06-14 NOTE — Patient Instructions (Addendum)
 VISIT SUMMARY:  Today, you had your annual wellness exam. We discussed your current medications, including your IUD, vitamin D  supplements, and prebiotics/probiotics.  You mentioned experiencing minor constipation over the past six months, which we addressed during the visit.  YOUR PLAN:  -ADULT WELLNESS VISIT: This was a routine check-up to ensure your overall health is on track. Your recent lab results, including hemoglobin, anemia, kidney, liver, and thyroid  function, were all normal. Your cholesterol levels were also normal in November 2023. We emphasized the importance of annual cholesterol screenings. We completed your wellness exam and will send the results through MyChart.  -CONSTIPATION: Constipation means having fewer bowel movements than usual or having hard, pellet-like stools. This can be caused by factors like decreased water intake and changes in diet or activity levels. To help with this, increase your water intake to 64 ounces daily and ensure you get 30 grams of fiber each day. You can use apps like My Fitness Pal or Lose It to track your fiber intake. If the constipation continues, you may consider using Miralax as needed.  Constipation, Adult Constipation is when a person has fewer than three bowel movements in a week, has difficulty having a bowel movement, or has stools (feces) that are dry, hard, or larger than normal. Constipation may be caused by an underlying condition. It may become worse with age if a person takes certain medicines and does not take in enough fluids. Follow these instructions at home: Eating and drinking  Eat foods that have a lot of fiber, such as beans, whole grains, and fresh fruits and vegetables. Limit foods that are low in fiber and high in fat and processed sugars, such as fried or sweet foods. These include french fries, hamburgers, cookies, candies, and soda. Drink enough fluid to keep your urine pale yellow. General instructions Exercise  regularly or as told by your health care provider. Try to do 150 minutes of moderate exercise each week. Use the bathroom when you have the urge to go. Do not hold it in. Take over-the-counter and prescription medicines only as told by your health care provider. This includes any fiber supplements. During bowel movements: Practice deep breathing while relaxing the lower abdomen. Practice pelvic floor relaxation. Watch your condition for any changes. Let your health care provider know about them. Keep all follow-up visits as told by your health care provider. This is important. Contact a health care provider if: You have pain that gets worse. You have a fever. You do not have a bowel movement after 4 days. You vomit. You are not hungry or you lose weight. You are bleeding from the opening between the buttocks (anus). You have thin, pencil-like stools. Get help right away if: You have a fever and your symptoms suddenly get worse. You leak stool or have blood in your stool. Your abdomen is bloated. You have severe pain in your abdomen. You feel dizzy or you faint. Summary Constipation is when a person has fewer than three bowel movements in a week, has difficulty having a bowel movement, or has stools (feces) that are dry, hard, or larger than normal. Eat foods that have a lot of fiber, such as beans, whole grains, and fresh fruits and vegetables. Drink enough fluid to keep your urine pale yellow. Take over-the-counter and prescription medicines only as told by your health care provider. This includes any fiber supplements. This information is not intended to replace advice given to you by your health care provider. Make sure you  discuss any questions you have with your health care provider. Document Revised: 08/18/2022 Document Reviewed: 08/18/2022 Elsevier Patient Education  2024 [employer].

## 2024-08-28 ENCOUNTER — Telehealth: Payer: Self-pay | Admitting: Internal Medicine

## 2024-08-28 ENCOUNTER — Encounter: Admitting: Internal Medicine

## 2024-08-28 DIAGNOSIS — E66811 Obesity, class 1: Secondary | ICD-10-CM

## 2024-08-28 DIAGNOSIS — E559 Vitamin D deficiency, unspecified: Secondary | ICD-10-CM

## 2024-08-28 DIAGNOSIS — E782 Mixed hyperlipidemia: Secondary | ICD-10-CM

## 2024-08-28 NOTE — Telephone Encounter (Signed)
 Patient is aware.

## 2024-08-28 NOTE — Telephone Encounter (Signed)
 Copied from CRM [id number]. Topic: Appointments - Scheduling Inquiry for Clinic >> Aug 28, 2024  9:22 AM Mesmerise C wrote: Reason for CRM: Patient has a physical appt today at 1 but states needing only labs done since [facility] but they couldn't do blood work, would also like hpv vaccine done

## 2024-09-07 ENCOUNTER — Encounter: Payer: Self-pay | Admitting: Internal Medicine

## 2024-09-10 ENCOUNTER — Encounter: Payer: Self-pay | Admitting: Internal Medicine

## 2024-09-10 ENCOUNTER — Telehealth: Admitting: Internal Medicine

## 2024-09-10 VITALS — Wt 210.0 lb

## 2024-09-10 DIAGNOSIS — M79644 Pain in right finger(s): Secondary | ICD-10-CM | POA: Diagnosis not present

## 2024-09-10 MED ORDER — MELOXICAM 7.5 MG PO TABS: 7.5000 mg | ORAL_TABLET | Freq: Every day | ORAL | 0 refills | Status: DC

## 2024-09-10 NOTE — Progress Notes (Signed)
 Per patient no change in vitals since last visit, unable to obtain new vitals due to telehealth visit.

## 2024-09-10 NOTE — Progress Notes (Signed)
 Virtual Visit via Video Note  I connected with Julia Ruiz on 09/10/24 at  9:00 AM EST by a video enabled telemedicine application and verified that I am speaking with the correct person using two identifiers.  Location patient: home Location provider: work office Persons participating in the virtual visit: patient, provider  I discussed the limitations of evaluation and management by telemedicine and the availability of in person appointments. The patient expressed understanding and agreed to proceed.   HPI: She scheduled this virtual visit to discuss her right thumb pain.  This has been present for about a week to 2 weeks.  No inciting injury that she can recall.  She feels the thumb click.  It is at the MCP joint.  She works from home and is on the computer for long periods of time in the day.   ROS: Negative unless indicated in HPI.  Past Medical History:  Diagnosis Date   Abnormal cervical Papanicolaou smear    as teenager, normal since per her report   Complication of anesthesia    spinal headache   Genital herpes    Gestational diabetes    with first pregnancy    Obesity (BMI 30.0-34.9)    Seasonal allergies    Vitamin D  deficiency     Past Surgical History:  Procedure Laterality Date   CESAREAN SECTION     CESAREAN SECTION     CESAREAN SECTION N/A 06/29/2017   Procedure: REPEAT CESAREAN SECTION;  Surgeon: Henry Slough, MD;  Location: [facility]IRTHING SUITES;  Service: Obstetrics;  Laterality: N/A;  Tracey RNFA    Family History  Problem Relation Age of Onset   Mental illness Brother    Irritable bowel syndrome Mother     SOCIAL HX:   reports that she has never smoked. She has never used smokeless tobacco. She reports current alcohol use. She reports that she does not use drugs.   Current Outpatient Medications:    meloxicam  (MOBIC ) 7.5 MG tablet, Take 1 tablet (7.5 mg total) by mouth daily., Disp: 30 tablet, Rfl: 0   Multiple Vitamin (MULTIVITAMIN WITH  MINERALS) TABS tablet, Take 1 tablet by mouth daily., Disp: , Rfl:    paragard  intrauterine copper  IUD IUD, Take 1 device by intrauterine route., Disp: , Rfl:    Probiotic Product (PROBIOTIC DAILY PO), Take by mouth., Disp: , Rfl:    VITAMIN D  PO, Take by mouth daily. Vit D3, Disp: , Rfl:   EXAM:   VITALS per patient if applicable: None reported  GENERAL: alert, oriented, appears well and in no acute distress  HEENT: atraumatic, conjunttiva clear, no obvious abnormalities on inspection of external nose and ears  NECK: normal movements of the head and neck  LUNGS: on inspection no signs of respiratory distress, breathing rate appears normal, no obvious gross increased work of breathing, gasping or wheezing  CV: no obvious cyanosis  MS: moves all visible extremities without noticeable abnormality  PSYCH/NEURO: pleasant and cooperative, no obvious depression or anxiety, speech and thought processing grossly intact  ASSESSMENT AND PLAN:   Thumb pain, right - Plan: meloxicam  (MOBIC ) 7.5 MG tablet  -Will attempt a course of meloxicam .  If not improved will consider referral to orthopedics.   I discussed the assessment and treatment plan with the patient. The patient was provided an opportunity to ask questions and all were answered. The patient agreed with the plan and demonstrated an understanding of the instructions.   The patient was advised to call back  or seek an in-person evaluation if the symptoms worsen or if the condition fails to improve as anticipated.    Tully Theophilus Andrews, MD   Primary Care at [facility]

## 2024-10-20 ENCOUNTER — Encounter: Payer: Self-pay | Admitting: Internal Medicine

## 2024-10-20 DIAGNOSIS — M79644 Pain in right finger(s): Secondary | ICD-10-CM

## 2024-11-07 ENCOUNTER — Encounter: Payer: Self-pay | Admitting: Internal Medicine

## 2024-11-08 ENCOUNTER — Telehealth: Admitting: Internal Medicine

## 2024-11-08 ENCOUNTER — Encounter: Payer: Self-pay | Admitting: Internal Medicine

## 2024-11-08 VITALS — Wt 212.0 lb

## 2024-11-08 DIAGNOSIS — E66811 Obesity, class 1: Secondary | ICD-10-CM

## 2024-11-08 DIAGNOSIS — Z6833 Body mass index (BMI) 33.0-33.9, adult: Secondary | ICD-10-CM

## 2024-11-08 MED ORDER — WEGOVY 1.5 MG PO TABS: 1.5000 mg | ORAL_TABLET | Freq: Every day | ORAL | 0 refills | Status: AC

## 2024-11-08 NOTE — Progress Notes (Signed)
 Per patient no change in vitals since last visit, unable to obtain new vitals due to telehealth visit.

## 2024-11-08 NOTE — Progress Notes (Signed)
 "   Virtual Visit via Video Note  I connected with Julia Ruiz on 11/08/24 at  4:00 PM EST by a video enabled telemedicine application and verified that I am speaking with the correct person using two identifiers.  Location patient: home Location provider: work office Persons participating in the virtual visit: patient, provider  I discussed the limitations of evaluation and management by telemedicine and the availability of in person appointments. The patient expressed understanding and agreed to proceed.   HPI: She scheduled this visit to discuss obesity.  She is interested in trying oral Wegovy .  She was on injectables in the past but was unable to afford.  She currently weighs 212 pounds.  She is feeling well.  Has no acute concerns or complaints.   ROS: Negative unless indicated in HPI.  Past Medical History:  Diagnosis Date   Abnormal cervical Papanicolaou smear    as teenager, normal since per her report   Complication of anesthesia    spinal headache   Genital herpes    Gestational diabetes    with first pregnancy    Obesity (BMI 30.0-34.9)    Seasonal allergies    Vitamin D  deficiency     Past Surgical History:  Procedure Laterality Date   CESAREAN SECTION     CESAREAN SECTION     CESAREAN SECTION N/A 06/29/2017   Procedure: REPEAT CESAREAN SECTION;  Surgeon: Henry Slough, MD;  Location: [facility] BIRTHING SUITES;  Service: Obstetrics;  Laterality: N/A;  Tracey RNFA    Family History  Problem Relation Age of Onset   Mental illness Brother    Irritable bowel syndrome Mother     SOCIAL HX:   reports that she has never smoked. She has never used smokeless tobacco. She reports current alcohol use. She reports that she does not use drugs.  Current Medications[1]  EXAM:   VITALS per patient if applicable: None reported other than weight of 212 pounds  GENERAL: alert, oriented, appears well and in no acute distress  HEENT: atraumatic, conjunttiva clear, no  obvious abnormalities on inspection of external nose and ears  NECK: normal movements of the head and neck  LUNGS: on inspection no signs of respiratory distress, breathing rate appears normal, no obvious gross increased work of breathing, gasping or wheezing  CV: no obvious cyanosis  MS: moves all visible extremities without noticeable abnormality  PSYCH/NEURO: pleasant and cooperative, no obvious depression or anxiety, speech and thought processing grossly intact  ASSESSMENT AND PLAN:   Class 1 obesity due to excess calories without serious comorbidity with body mass index (BMI) of 33.0 to 33.9 in adult - Plan: semaglutide -weight management (WEGOVY ) 1.5 MG tablet  -She has elected to try oral Wegovy .  Will start 1.5 mg daily, plan to increase dose monthly as tolerated.   I discussed the assessment and treatment plan with the patient. The patient was provided an opportunity to ask questions and all were answered. The patient agreed with the plan and demonstrated an understanding of the instructions.   The patient was advised to call back or seek an in-person evaluation if the symptoms worsen or if the condition fails to improve as anticipated.    Tully Theophilus Andrews, MD  [facility] at [facility]    [1]  Current Outpatient Medications:    Multiple Vitamin (MULTIVITAMIN WITH MINERALS) TABS tablet, Take 1 tablet by mouth daily., Disp: , Rfl:    paragard  intrauterine copper  IUD IUD, Take 1 device by intrauterine route., Disp: ,  Rfl:    Probiotic Product (PROBIOTIC DAILY PO), Take by mouth., Disp: , Rfl:    semaglutide -weight management (WEGOVY ) 1.5 MG tablet, Take 1 tablet (1.5 mg total) by mouth daily. Daily in AM on an empty stomach with 4 oz of water. Do not eat or drink for 30 minutes after dose., Disp: 30 tablet, Rfl: 0   VITAMIN D  PO, Take by mouth daily. Vit D3, Disp: , Rfl:   "

## 2024-12-13 ENCOUNTER — Encounter: Admitting: Internal Medicine
